# Patient Record
Sex: Female | Born: 1980 | Race: White | Hispanic: No | Marital: Single | Smoking: Never smoker
Health system: Southern US, Community
[De-identification: ages and names within clinical notes are randomized; demographics above are authoritative.]

## PROBLEM LIST (undated history)

## (undated) DIAGNOSIS — K219 Gastro-esophageal reflux disease without esophagitis: Secondary | ICD-10-CM

## (undated) DIAGNOSIS — I370 Nonrheumatic pulmonary valve stenosis: Secondary | ICD-10-CM

## (undated) DIAGNOSIS — R011 Cardiac murmur, unspecified: Secondary | ICD-10-CM

## (undated) DIAGNOSIS — Z9109 Other allergy status, other than to drugs and biological substances: Secondary | ICD-10-CM

## (undated) DIAGNOSIS — R160 Hepatomegaly, not elsewhere classified: Secondary | ICD-10-CM

## (undated) HISTORY — DX: Other allergy status, other than to drugs and biological substances: Z91.09

## (undated) HISTORY — DX: Cardiac murmur, unspecified: R01.1

## (undated) HISTORY — DX: Hepatomegaly, not elsewhere classified: R16.0

## (undated) HISTORY — DX: Gastro-esophageal reflux disease without esophagitis: K21.9

## (undated) HISTORY — DX: Nonrheumatic pulmonary valve stenosis: I37.0

---

## 2002-05-08 ENCOUNTER — Other Ambulatory Visit: Admission: RE | Admit: 2002-05-08 | Discharge: 2002-05-08 | Payer: Self-pay | Admitting: Internal Medicine

## 2002-06-10 ENCOUNTER — Encounter: Admission: RE | Admit: 2002-06-10 | Discharge: 2002-09-08 | Payer: Self-pay | Admitting: Internal Medicine

## 2002-09-02 ENCOUNTER — Encounter: Payer: Self-pay | Admitting: Internal Medicine

## 2002-09-02 ENCOUNTER — Encounter: Admission: RE | Admit: 2002-09-02 | Discharge: 2002-09-02 | Payer: Self-pay | Admitting: Internal Medicine

## 2002-09-30 ENCOUNTER — Encounter: Admission: RE | Admit: 2002-09-30 | Discharge: 2002-12-29 | Payer: Self-pay | Admitting: Internal Medicine

## 2003-02-01 ENCOUNTER — Encounter: Admission: RE | Admit: 2003-02-01 | Discharge: 2003-05-02 | Payer: Self-pay | Admitting: Internal Medicine

## 2012-07-23 ENCOUNTER — Other Ambulatory Visit: Payer: Self-pay | Admitting: Gastroenterology

## 2012-07-23 DIAGNOSIS — K219 Gastro-esophageal reflux disease without esophagitis: Secondary | ICD-10-CM

## 2012-08-18 ENCOUNTER — Ambulatory Visit
Admission: RE | Admit: 2012-08-18 | Discharge: 2012-08-18 | Disposition: A | Discharge status: Home / Self Care | Payer: BC Managed Care – PPO | Source: Ambulatory Visit | Attending: Gastroenterology | Admitting: Gastroenterology

## 2012-08-18 ENCOUNTER — Other Ambulatory Visit: Payer: Self-pay

## 2012-08-18 DIAGNOSIS — K219 Gastro-esophageal reflux disease without esophagitis: Secondary | ICD-10-CM

## 2013-07-22 ENCOUNTER — Other Ambulatory Visit: Payer: Self-pay | Admitting: Family Medicine

## 2013-07-22 ENCOUNTER — Other Ambulatory Visit (HOSPITAL_COMMUNITY)
Admission: RE | Admit: 2013-07-22 | Discharge: 2013-07-22 | Disposition: A | Discharge status: Home / Self Care | Payer: BC Managed Care – PPO | Source: Ambulatory Visit | Attending: Family Medicine | Admitting: Family Medicine

## 2013-07-22 DIAGNOSIS — Z01419 Encounter for gynecological examination (general) (routine) without abnormal findings: Secondary | ICD-10-CM | POA: Insufficient documentation

## 2013-09-10 ENCOUNTER — Encounter: Payer: Self-pay | Admitting: General Surgery

## 2013-09-10 ENCOUNTER — Telehealth: Payer: Self-pay | Admitting: Cardiology

## 2013-09-10 NOTE — Telephone Encounter (Signed)
Patient does not need SBE prophylaxis for dental procedures.  She has very mild PS and there is no indication for SBE prophylaxis.

## 2013-09-10 NOTE — Telephone Encounter (Signed)
Faxed signed copy over to pts dentist for pt.

## 2013-09-10 NOTE — Telephone Encounter (Signed)
Please get echo and last OV note

## 2013-09-10 NOTE — Telephone Encounter (Signed)
Printed and given to Dr Mayford Knifeurner

## 2013-09-10 NOTE — Telephone Encounter (Signed)
New message  Patient has dental appt on Monday and needs a note faxed over that she doest have to take a pre med prior to dental work. Fax # [phone].

## 2013-09-10 NOTE — Telephone Encounter (Signed)
To Dr Mayford Knifeurner to have her state pt does not need pre med prior to dental work.

## 2014-02-19 ENCOUNTER — Encounter: Payer: Self-pay | Admitting: General Surgery

## 2014-02-19 DIAGNOSIS — I370 Nonrheumatic pulmonary valve stenosis: Secondary | ICD-10-CM

## 2014-03-25 ENCOUNTER — Encounter: Payer: Self-pay | Admitting: Cardiology

## 2014-03-25 ENCOUNTER — Ambulatory Visit (INDEPENDENT_AMBULATORY_CARE_PROVIDER_SITE_OTHER): Payer: BC Managed Care – PPO | Admitting: Cardiology

## 2014-03-25 VITALS — BP 122/78 | HR 84 | Ht 66.0 in | Wt 195.1 lb

## 2014-03-25 DIAGNOSIS — I379 Nonrheumatic pulmonary valve disorder, unspecified: Secondary | ICD-10-CM

## 2014-03-25 DIAGNOSIS — I370 Nonrheumatic pulmonary valve stenosis: Secondary | ICD-10-CM

## 2014-03-25 NOTE — Progress Notes (Signed)
  [address] [place], [place]  [address] Phone: [phone]) [phone] Fax:  [phone](336) [phone]  Date:  03/25/2014   ID:  Kathleen ServiceAmber Little, DOB 1980/10/09, MRN [id number]  PCP:  Leanor RubensteinSUN,VYVYAN Y, MD  Cardiologist:  Armanda Magicraci Lamaj Metoyer, MD     History of Present Illness: Kathleen Little is a 33 y.o. female with a history of pulmonic stenosis who presents today for followup.  She is doing well.  She denies any chest pain, SOB, DOE, LE edema, dizziness, or syncope.  Occasionally she will have some skipped heart beats.   Wt Readings from Last 3 Encounters:  03/25/14 195 lb 1.9 oz (88.506 kg)     Past Medical History  Diagnosis Date  . Pulmonic stenosis     Dr Mayford Knifeturner  . GERD (gastroesophageal reflux disease)   . Environmental allergies   . Heart murmur     Current Outpatient Prescriptions  Medication Sig Dispense Refill  . cetirizine (ZYRTEC) 10 MG tablet Take 10 mg by mouth daily.      Marland Kitchen. levonorgestrel-ethinyl estradiol (SEASONALE,INTROVALE,JOLESSA) 0.15-0.03 MG tablet Take 1 tablet by mouth daily.       No current facility-administered medications for this visit.    Allergies:   No Known Allergies  Social History:  The patient  reports that she has never smoked. She does not have any smokeless tobacco history on file. She reports that she drinks alcohol. She reports that she does not use illicit drugs.   Family History:  The patient's family history includes Cancer - Colon in her father.   ROS:  Please see the history of present illness.      All other systems reviewed and negative.   PHYSICAL EXAM: VS:  BP 122/78  Pulse 84  Ht 5\' 6"  (1.676 m)  Wt 195 lb 1.9 oz (88.506 kg)  BMI 31.51 kg/m2 Well nourished, well developed, in no acute distress HEENT: normal Neck: no JVD Cardiac:  normal S1, S2; RRR; faint systolic murmur at RUSB Lungs:  clear to auscultation bilaterally, no wheezing, rhonchi or rales Abd: soft, nontender, no hepatomegaly Ext: no edema Skin: warm and dry Neuro:  CNs  2-12 intact, no focal abnormalities noted  EKG:  NSR with no ST changes     ASSESSMENT AND PLAN:  Pulmonic stenosis - Following with serial echoes every 3 year- will set up for next year right before her OV with me  Followup with me in 1 year  Signed, Armanda Magicraci Shaniquia Brafford, MD 03/25/2014 10:15 AM

## 2014-03-25 NOTE — Patient Instructions (Signed)
Your physician recommends that you continue on your current medications as directed. Please refer to the Current Medication list given to you today.  Your physician has requested that you have an echocardiogram. Echocardiography is a painless test that uses sound waves to create images of your heart. It provides your doctor with information about the size and shape of your heart and how well your heart's chambers and valves are working. This procedure takes approximately one hour. There are no restrictions for this procedure. (Schedule 02/2015 one week prior to office vist with Dr Mayford Knifeurner)  Your physician wants you to follow-up in: 1 year with Dr Sherlyn Lickurner You will receive a reminder letter in the mail two months in advance. If you don't receive a letter, please call our office to schedule the follow-up appointment.

## 2014-03-31 ENCOUNTER — Encounter: Payer: Self-pay | Admitting: Cardiology

## 2014-04-09 ENCOUNTER — Ambulatory Visit: Payer: BC Managed Care – PPO | Admitting: Cardiology

## 2014-12-21 ENCOUNTER — Encounter: Payer: Self-pay | Admitting: Cardiology

## 2015-03-25 ENCOUNTER — Ambulatory Visit (HOSPITAL_COMMUNITY): Payer: BC Managed Care – PPO | Attending: Cardiology

## 2015-03-25 ENCOUNTER — Other Ambulatory Visit: Payer: Self-pay

## 2015-03-25 ENCOUNTER — Other Ambulatory Visit: Payer: Self-pay | Admitting: Cardiology

## 2015-03-25 DIAGNOSIS — I370 Nonrheumatic pulmonary valve stenosis: Secondary | ICD-10-CM

## 2015-03-25 DIAGNOSIS — I371 Nonrheumatic pulmonary valve insufficiency: Secondary | ICD-10-CM | POA: Insufficient documentation

## 2015-03-25 DIAGNOSIS — I340 Nonrheumatic mitral (valve) insufficiency: Secondary | ICD-10-CM | POA: Diagnosis not present

## 2015-04-04 ENCOUNTER — Ambulatory Visit: Payer: BC Managed Care – PPO | Admitting: Cardiology

## 2015-05-05 ENCOUNTER — Ambulatory Visit: Payer: BC Managed Care – PPO | Admitting: Cardiology

## 2016-03-30 ENCOUNTER — Other Ambulatory Visit: Payer: Self-pay | Admitting: Family Medicine

## 2016-03-30 ENCOUNTER — Ambulatory Visit
Admission: RE | Admit: 2016-03-30 | Discharge: 2016-03-30 | Disposition: A | Discharge status: Home / Self Care | Payer: BC Managed Care – PPO | Source: Ambulatory Visit | Attending: Family Medicine | Admitting: Family Medicine

## 2016-03-30 DIAGNOSIS — M25561 Pain in right knee: Secondary | ICD-10-CM

## 2016-06-25 ENCOUNTER — Ambulatory Visit: Payer: BC Managed Care – PPO | Admitting: Cardiology

## 2016-07-06 ENCOUNTER — Other Ambulatory Visit: Payer: Self-pay | Admitting: Family Medicine

## 2016-07-06 ENCOUNTER — Other Ambulatory Visit (HOSPITAL_COMMUNITY)
Admission: RE | Admit: 2016-07-06 | Discharge: 2016-07-06 | Disposition: A | Discharge status: Home / Self Care | Payer: BC Managed Care – PPO | Source: Ambulatory Visit | Attending: Family Medicine | Admitting: Family Medicine

## 2016-07-06 DIAGNOSIS — Z1151 Encounter for screening for human papillomavirus (HPV): Secondary | ICD-10-CM | POA: Insufficient documentation

## 2016-07-06 DIAGNOSIS — Z01411 Encounter for gynecological examination (general) (routine) with abnormal findings: Secondary | ICD-10-CM | POA: Insufficient documentation

## 2016-07-09 LAB — CYTOLOGY - PAP
Diagnosis: NEGATIVE
HPV: NOT DETECTED

## 2016-08-01 ENCOUNTER — Encounter: Payer: Self-pay | Admitting: Cardiology

## 2016-08-05 NOTE — Progress Notes (Signed)
Cardiology Office Note    Date:  08/06/2016   ID:  Kathleen ServiceAmber Ganim, DOB 1980-10-02, MRN [id number]  PCP:  Leanor RubensteinSUN,VYVYAN Y, MD  Cardiologist:  Armanda Magicraci Brynlei Klausner, MD   Chief Complaint  Patient presents with  . Follow-up    pulmonic stenosis    History of Present Illness:  Kathleen Servicember Seybold is a 35 y.o. female with a history of pulmonic stenosis who presents today for followup.  She is doing well.  She denies any chest pain, SOB, DOE, LE edema, dizziness, or syncope. She has not had any problems with palpitations until this past weekend.  She has had some problems with sinus issues and has had a sinus headache and has been taking cold meds.    Past Medical History:  Diagnosis Date  . Environmental allergies   . GERD (gastroesophageal reflux disease)   . Heart murmur   . Pulmonic stenosis    Dr Mayford Knifeturner    No past surgical history on file.  Current Medications: Outpatient Medications Prior to Visit  Medication Sig Dispense Refill  . cetirizine (ZYRTEC) 10 MG tablet Take 10 mg by mouth daily.    Marland Kitchen. levonorgestrel-ethinyl estradiol (SEASONALE,INTROVALE,JOLESSA) 0.15-0.03 MG tablet Take 1 tablet by mouth daily.     No facility-administered medications prior to visit.      Allergies:   Patient has no known allergies.   Social History   Social History  . Marital status: Single    Spouse name: N/A  . Number of children: N/A  . Years of education: N/A   Social History Main Topics  . Smoking status: Never Smoker  . Smokeless tobacco: Never Used  . Alcohol use Yes     Comment: rare glass of wine  . Drug use: No  . Sexual activity: Not on file   Other Topics Concern  . Not on file   Social History Narrative  . No narrative on file     Family History:  The patient's family history includes Cancer - Colon in her father.   ROS:   Please see the history of present illness.    ROS All other systems reviewed and are negative.  No flowsheet data found.     PHYSICAL EXAM:   VS:   BP 136/82   Pulse 84   Ht 5' 5.5" (1.664 m)   Wt 193 lb (87.5 kg)   BMI 31.63 kg/m    GEN: Well nourished, well developed, in no acute distress  HEENT: normal  Neck: no JVD, carotid bruits, or masses Cardiac: RRR; no  rubs, or gallops,no edema.  Intact distal pulses bilaterally.   2/6 SM at LUSB to LLSB Respiratory:  clear to auscultation bilaterally, normal work of breathing GI: soft, nontender, nondistended, + BS MS: no deformity or atrophy  Skin: warm and dry, no rash Neuro:  Alert and Oriented x 3, Strength and sensation are intact Psych: euthymic mood, full affect  Wt Readings from Last 3 Encounters:  08/06/16 193 lb (87.5 kg)  03/25/14 195 lb 1.9 oz (88.5 kg)      Studies/Labs Reviewed:   EKG:  EKG is ordered today.  The ekg ordered today demonstrates NSR at 84bpm with no ST changes  Recent Labs: No results found for requested labs within last 8760 hours.   Lipid Panel No results found for: CHOL, TRIG, HDL, CHOLHDL, VLDL, LDLCALC, LDLDIRECT  Additional studies/ records that were reviewed today include:  none    ASSESSMENT:    1. Pulmonary valve stenosis,  unspecified etiology   2. Heart palpitations      PLAN:  In order of problems listed above:  1. Pulmonic stenosis - mild by echo a year ago  - Repeat echo in 1 year 2. Palpitation- she has had some palpitations over the weekend but has been taking some cold meds.  She says that the palpitations are not bad like they were in 2013 when a heart monitor was normal.  I encouraged her to let me know if they do not resolve and we can repeat a heart monitor.       Medication Adjustments/Labs and Tests Ordered: Current medicines are reviewed at length with the patient today.  Concerns regarding medicines are outlined above.  Medication changes, Labs and Tests ordered today are listed in the Patient Instructions below.  There are no Patient Instructions on file for this visit.   Signed, Armanda Magicraci Kelsa Jaworowski, MD    08/06/2016 8:57 AM    [facility] [address], [place], [place]  [address] Phone: [phone](336) [phone]; Fax: [phone]) [phone]

## 2016-08-06 ENCOUNTER — Ambulatory Visit (INDEPENDENT_AMBULATORY_CARE_PROVIDER_SITE_OTHER): Payer: BC Managed Care – PPO | Admitting: Cardiology

## 2016-08-06 VITALS — BP 136/82 | HR 84 | Ht 65.5 in | Wt 193.0 lb

## 2016-08-06 DIAGNOSIS — R002 Palpitations: Secondary | ICD-10-CM

## 2016-08-06 DIAGNOSIS — I370 Nonrheumatic pulmonary valve stenosis: Secondary | ICD-10-CM

## 2016-08-06 NOTE — Patient Instructions (Signed)

## 2017-05-15 IMAGING — CR DG KNEE 1-2V*R*
2 series · 2 of 2 positions shown · non-contrast
Comparison: None.

CLINICAL DATA: Anterior right knee pain after walking.

EXAM:
RIGHT KNEE - 1-2 VIEW

[w knee ap right]
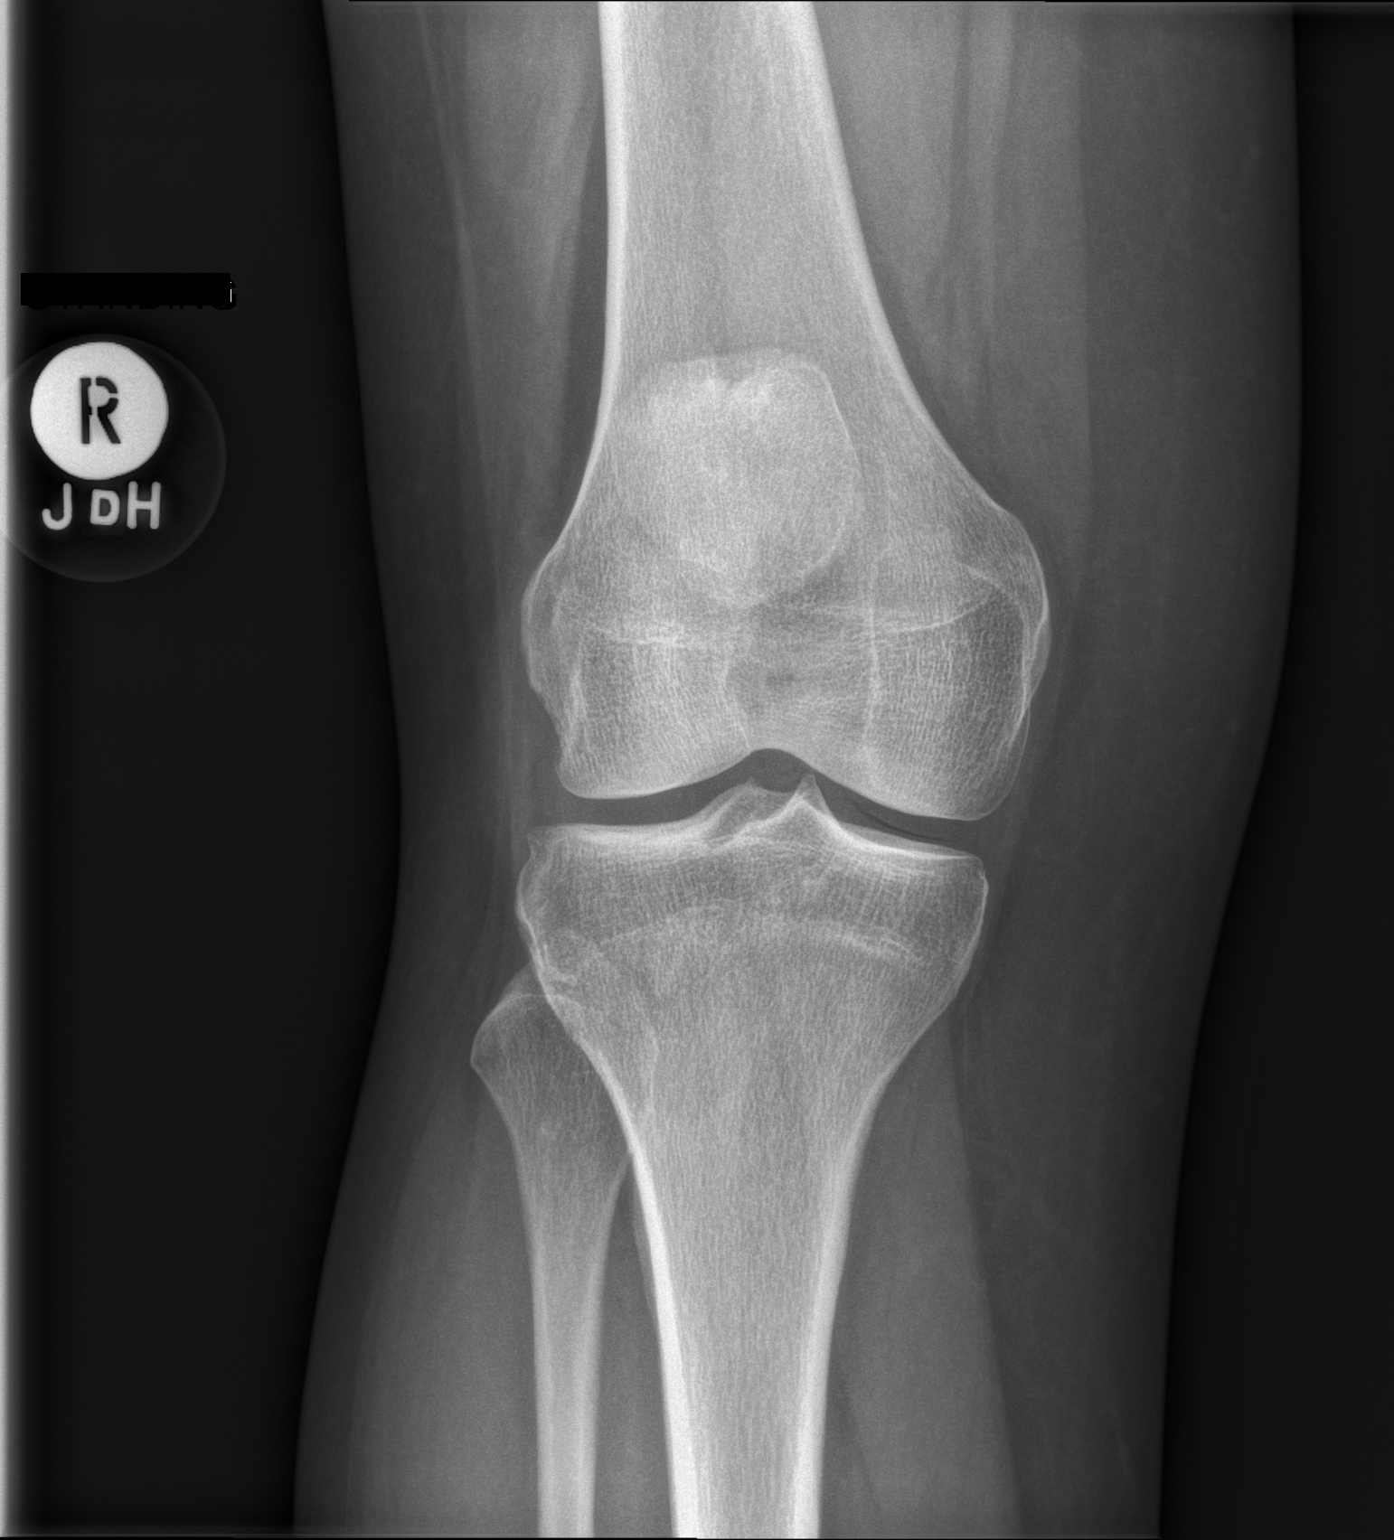

[w knee lat right]
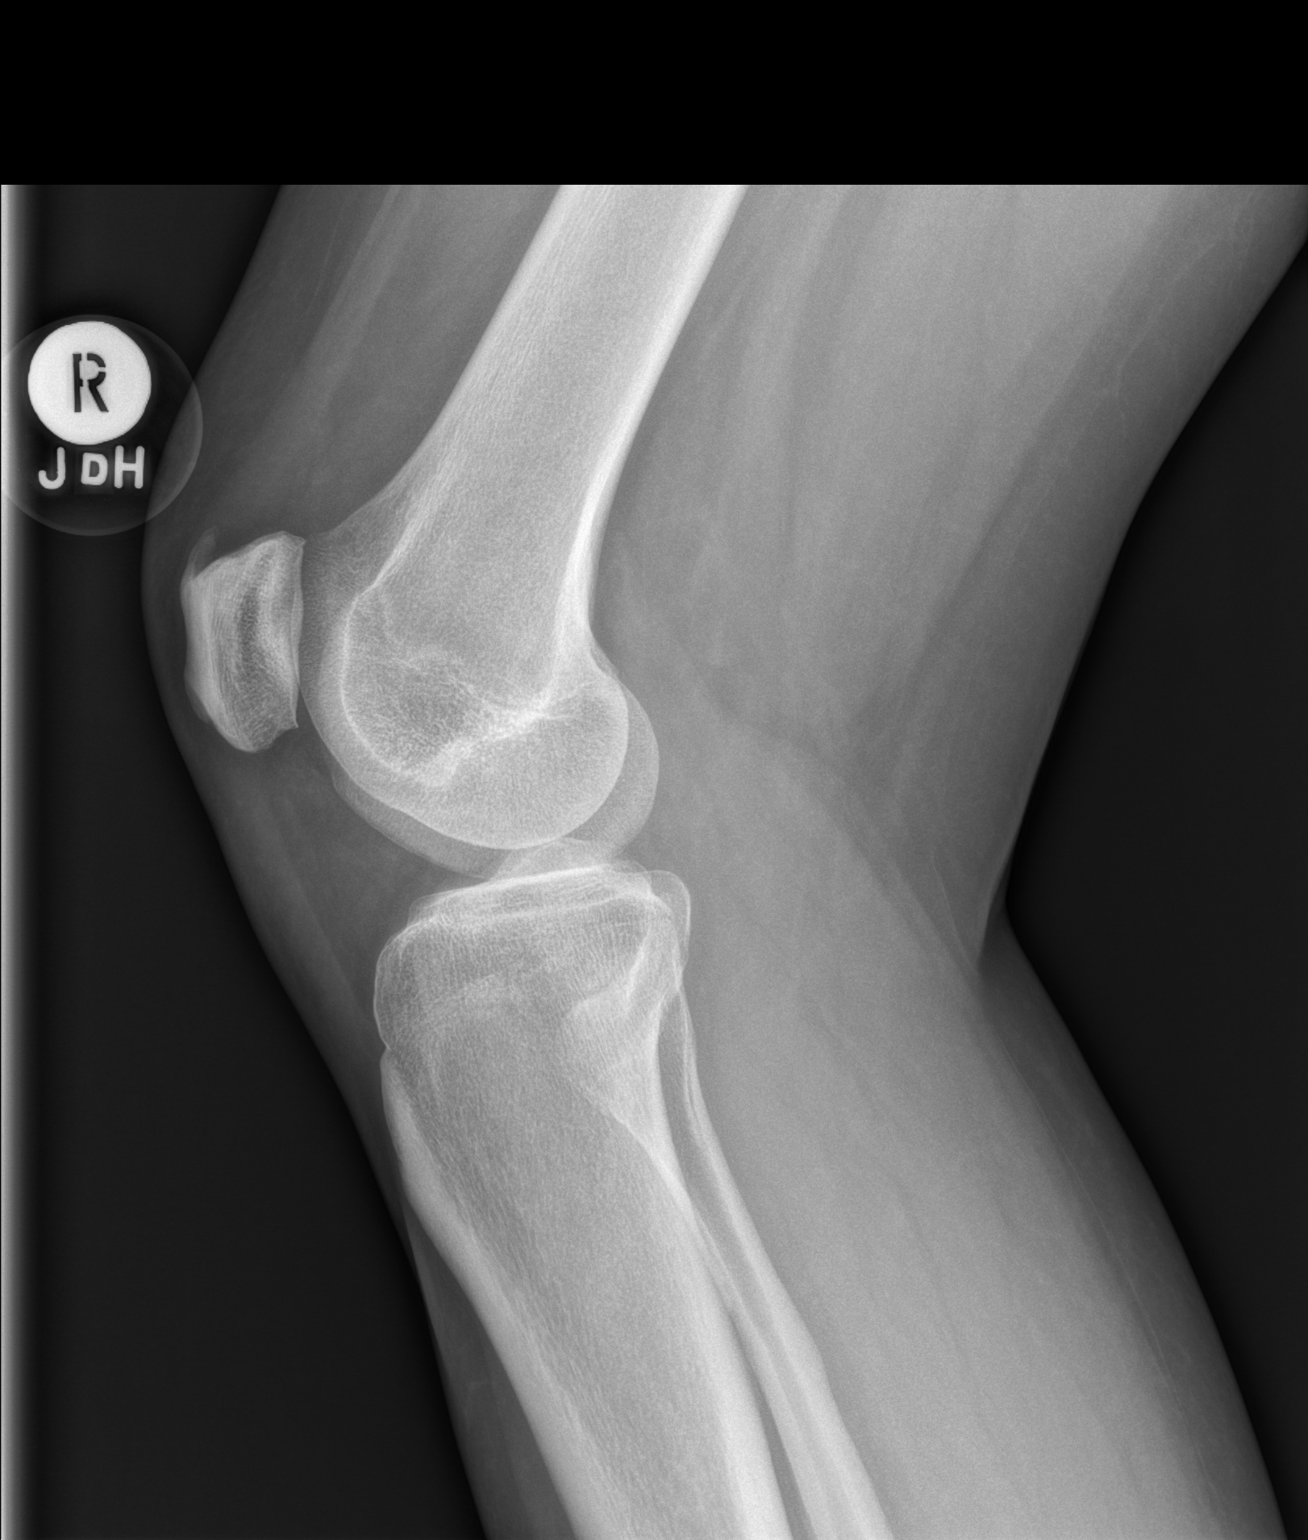

[2 of 2 positions shown; findings below may reference images not displayed]

FINDINGS: Mild tibial spine spur formation. Minimal lateral and posterior
patellar spur formation. Moderate-sized anterior patellar spurs. No
effusion.
IMPRESSION: Minimal degenerative changes.

## 2019-03-12 ENCOUNTER — Other Ambulatory Visit (HOSPITAL_COMMUNITY)
Admission: RE | Admit: 2019-03-12 | Discharge: 2019-03-12 | Disposition: A | Discharge status: Home / Self Care | Payer: BC Managed Care – PPO | Source: Ambulatory Visit | Attending: Family Medicine | Admitting: Family Medicine

## 2019-03-12 ENCOUNTER — Other Ambulatory Visit: Payer: Self-pay | Admitting: Family Medicine

## 2019-03-12 DIAGNOSIS — Z01411 Encounter for gynecological examination (general) (routine) with abnormal findings: Secondary | ICD-10-CM | POA: Diagnosis present

## 2019-03-17 LAB — CYTOLOGY - PAP
Diagnosis: NEGATIVE
HPV: NOT DETECTED

## 2019-06-29 NOTE — Progress Notes (Signed)
Cardiology Office Note:    Date:  06/30/2019   ID:  [name], DOB 1981-05-24, MRN [id number]  PCP:  Deatra James, MD  Cardiologist:  No primary care provider on file.    Referring MD: Deatra James, MD   Chief Complaint  Patient presents with  . Follow-up    Pulmonic stenosis    History of Present Illness:    Kathleen Little is a 38 y.o. female with a hx of pulmonic stenosis.  She is here today for followup and is doing well.  She denies any chest pain or pressure, SOB, DOE, PND, orthopnea, LE edema, dizziness, palpitations or syncope. She is compliant with her meds and is tolerating meds with no SE.    Past Medical History:  Diagnosis Date  . Environmental allergies   . GERD (gastroesophageal reflux disease)   . Heart murmur   . Pulmonic stenosis    Dr Mayford Knife    History reviewed. No pertinent surgical history.  Current Medications: Current Meds  Medication Sig  . cetirizine (ZYRTEC) 10 MG tablet Take 10 mg by mouth daily.     Allergies:   Patient has no known allergies.   Social History   Socioeconomic History  . Marital status: Single    Spouse name: Not on file  . Number of children: Not on file  . Years of education: Not on file  . Highest education level: Not on file  Occupational History  . Not on file  Social Needs  . Financial resource strain: Not on file  . Food insecurity    Worry: Not on file    Inability: Not on file  . Transportation needs    Medical: Not on file    Non-medical: Not on file  Tobacco Use  . Smoking status: Never Smoker  . Smokeless tobacco: Never Used  Substance and Sexual Activity  . Alcohol use: Yes    Comment: rare glass of wine  . Drug use: No  . Sexual activity: Not on file  Lifestyle  . Physical activity    Days per week: Not on file    Minutes per session: Not on file  . Stress: Not on file  Relationships  . Social Musician on phone: Not on file    Gets together: Not on file    Attends religious  service: Not on file    Active member of club or organization: Not on file    Attends meetings of clubs or organizations: Not on file    Relationship status: Not on file  Other Topics Concern  . Not on file  Social History Narrative  . Not on file     Family History: The patient's family history includes Cancer - Colon in her father.  ROS:   Please see the history of present illness.    ROS  All other systems reviewed and negative.   EKGs/Labs/Other Studies Reviewed:    The following studies were reviewed today: none  EKG:  EKG is  ordered today.  The ekg ordered today demonstrates NSR with no ST changes  Recent Labs: No results found for requested labs within last 8760 hours.   Recent Lipid Panel No results found for: CHOL, TRIG, HDL, CHOLHDL, VLDL, LDLCALC, LDLDIRECT  Physical Exam:    VS:  BP 130/80   Pulse 87   Ht 5' 5.5" (1.664 m)   Wt 186 lb 9.6 oz (84.6 kg)   BMI 30.58 kg/m     Wt  Readings from Last 3 Encounters:  06/30/19 186 lb 9.6 oz (84.6 kg)  08/06/16 193 lb (87.5 kg)  03/25/14 195 lb 1.9 oz (88.5 kg)     GEN:  Well nourished, well developed in no acute distress HEENT: Normal NECK: No JVD; No carotid bruits LYMPHATICS: No lymphadenopathy CARDIAC: RRR, no murmurs, rubs, gallops RESPIRATORY:  Clear to auscultation without rales, wheezing or rhonchi  ABDOMEN: Soft, non-tender, non-distended MUSCULOSKELETAL:  No edema; No deformity  SKIN: Warm and dry NEUROLOGIC:  Alert and oriented x 3 PSYCHIATRIC:  Normal affect   ASSESSMENT:    1. Nonrheumatic pulmonary valve stenosis    PLAN:    In order of problems listed above:  1.  Pulmonic stenosis -she has not been seen in almost 3 years and last echo 01/2015 showed mild PS with peak PV gradient 19mmHg and mild PR -repeat echo to make sure PS and PR have not progressed  Medication Adjustments/Labs and Tests Ordered: Current medicines are reviewed at length with the patient today.  Concerns  regarding medicines are outlined above.  Orders Placed This Encounter  Procedures  . EKG 12-Lead   No orders of the defined types were placed in this encounter.   Signed, Fransico Him, MD  06/30/2019 1:28 PM    [place]

## 2019-06-30 ENCOUNTER — Encounter: Payer: Self-pay | Admitting: Cardiology

## 2019-06-30 ENCOUNTER — Ambulatory Visit: Payer: BC Managed Care – PPO | Admitting: Cardiology

## 2019-06-30 ENCOUNTER — Other Ambulatory Visit: Payer: Self-pay

## 2019-06-30 ENCOUNTER — Encounter (INDEPENDENT_AMBULATORY_CARE_PROVIDER_SITE_OTHER): Payer: Self-pay

## 2019-06-30 VITALS — BP 130/80 | HR 87 | Ht 65.5 in | Wt 186.6 lb

## 2019-06-30 DIAGNOSIS — I370 Nonrheumatic pulmonary valve stenosis: Secondary | ICD-10-CM | POA: Diagnosis not present

## 2019-06-30 NOTE — Patient Instructions (Signed)
Medication Instructions:   Your physician recommends that you continue on your current medications as directed. Please refer to the Current Medication list given to you today.  *If you need a refill on your cardiac medications before your next appointment, please call your pharmacy*    Testing/Procedures:  Your physician has requested that you have an echocardiogram. Echocardiography is a painless test that uses sound waves to create images of your heart. It provides your doctor with information about the size and shape of your heart and how well your heart's chambers and valves are working. This procedure takes approximately one hour. There are no restrictions for this procedure.    Follow-Up: At [facility], you and your health needs are our priority.  As part of our continuing mission to provide you with exceptional heart care, we have created designated Provider Care Teams.  These Care Teams include your primary Cardiologist (physician) and Advanced Practice Providers (APPs -  Physician Assistants and Nurse Practitioners) who all work together to provide you with the care you need, when you need it.  Your next appointment:   12 months  The format for your next appointment:   Either In Person or Virtual  Provider:   Fransico Him, MD

## 2019-07-13 ENCOUNTER — Telehealth: Payer: Self-pay | Admitting: Cardiology

## 2019-07-13 NOTE — Telephone Encounter (Signed)
Nita from [facility] called. Stated patient's PCP is Dr. Maurice Small. Patient's chart updated.

## 2019-07-22 ENCOUNTER — Other Ambulatory Visit: Payer: Self-pay

## 2019-07-22 ENCOUNTER — Ambulatory Visit (HOSPITAL_COMMUNITY): Payer: BC Managed Care – PPO | Attending: Cardiovascular Disease

## 2019-07-22 DIAGNOSIS — I370 Nonrheumatic pulmonary valve stenosis: Secondary | ICD-10-CM | POA: Insufficient documentation

## 2019-07-22 NOTE — Progress Notes (Signed)
Patient did not consent to the use of Definity. 

## 2019-07-24 ENCOUNTER — Encounter: Payer: Self-pay | Admitting: Cardiology

## 2019-07-27 ENCOUNTER — Telehealth: Payer: Self-pay

## 2019-07-27 NOTE — Telephone Encounter (Signed)
Notes recorded by Frederik Schmidt, RN on 07/27/2019 at 9:10 AM EST  The patient has been notified of the result and verbalized understanding. All questions (if any) were answered.  Frederik Schmidt, RN 07/27/2019 9:10 AM

## 2019-07-27 NOTE — Telephone Encounter (Signed)
-----   Message from Sueanne Margarita, MD sent at 07/24/2019  9:56 AM EST ----- Echo showed normal LVF with trivial leakiness of AV and mild pulmonic stenosis and mildly leaky PV. Compared to prior echo , the mean PVG is 72mmHg and peak PVG 53mmHg which is same as in 2016.   Repeat echo in 2 years

## 2019-10-24 ENCOUNTER — Ambulatory Visit: Payer: BC Managed Care – PPO | Attending: Internal Medicine

## 2019-10-24 DIAGNOSIS — Z23 Encounter for immunization: Secondary | ICD-10-CM | POA: Insufficient documentation

## 2019-10-24 NOTE — Progress Notes (Signed)
   Covid-19 Vaccination Clinic  Name:  Kathleen Little    MRN: [id number] DOB: 21-Jun-1981  10/24/2019  Ms. Reach was observed post Covid-19 immunization for 15 minutes without incidence. She was provided with Vaccine Information Sheet and instruction to access the V-Safe system.   [name] was instructed to call 911 with any severe reactions post vaccine: Marland Kitchen Difficulty breathing  . Swelling of your face and throat  . A fast heartbeat  . A bad rash all over your body  . Dizziness and weakness    Immunizations Administered    Name Date Dose VIS Date Route   Pfizer COVID-19 Vaccine 10/24/2019 11:02 AM 0.3 mL 08/07/2019 Intramuscular   Manufacturer: [employer], [employer]   Lot: [id number]   NDC: [address]-8

## 2019-11-14 ENCOUNTER — Ambulatory Visit: Payer: BC Managed Care – PPO

## 2019-11-18 ENCOUNTER — Ambulatory Visit: Payer: BC Managed Care – PPO

## 2019-11-18 ENCOUNTER — Ambulatory Visit: Payer: BC Managed Care – PPO | Attending: Internal Medicine

## 2019-11-18 DIAGNOSIS — Z23 Encounter for immunization: Secondary | ICD-10-CM

## 2019-11-18 NOTE — Progress Notes (Signed)
   Covid-19 Vaccination Clinic  Name:  Kathleen Little    MRN: [id number] DOB: 02/16/1981  11/18/2019  [name] was observed post Covid-19 immunization for 15 minutes without incident. She was provided with Vaccine Information Sheet and instruction to access the V-Safe system.   [name] was instructed to call 911 with any severe reactions post vaccine: Marland Kitchen Difficulty breathing  . Swelling of face and throat  . A fast heartbeat  . A bad rash all over body  . Dizziness and weakness   Immunizations Administered    Name Date Dose VIS Date Route   Pfizer COVID-19 Vaccine 11/18/2019 10:32 AM 0.3 mL 08/07/2019 Intramuscular   Manufacturer: [employer], [employer]   Lot: [id number]   NDC: [address]-1

## 2020-08-22 ENCOUNTER — Ambulatory Visit: Payer: BC Managed Care – PPO | Admitting: Cardiology

## 2020-08-22 ENCOUNTER — Encounter: Payer: Self-pay | Admitting: Cardiology

## 2020-08-22 ENCOUNTER — Other Ambulatory Visit: Payer: Self-pay

## 2020-08-22 VITALS — BP 138/80 | HR 70 | Ht 65.5 in | Wt 187.4 lb

## 2020-08-22 DIAGNOSIS — I370 Nonrheumatic pulmonary valve stenosis: Secondary | ICD-10-CM | POA: Diagnosis not present

## 2020-08-22 NOTE — Progress Notes (Signed)
Cardiology Office Note:    Date:  08/22/2020   ID:  Kathleen Little, DOB [birth year], MRN [id number]  PCP:  Shirlean Mylar, MD  Cardiologist:  No primary care provider on file.    Referring MD: Shirlean Mylar, MD   Chief Complaint  Patient presents with  . Follow-up    Pulmonic stenosis    History of Present Illness:    Kathleen Little is a 39 y.o. female with a hx of pulmonic stenosis.  She is here today for followup and is doing well.  She denies any exertional chest pain or pressure, SOB, DOE, PND, orthopnea, LE edema, dizziness, palpitations or syncope.  She has chronic GERD sx.  She exercises frequently and has no problems with symptoms. She is compliant with her meds and is tolerating meds with no SE.    Past Medical History:  Diagnosis Date  . Environmental allergies   . GERD (gastroesophageal reflux disease)   . Heart murmur   . Pulmonic stenosis    2D echo 06/2019 showed mild PS with peak PVG and mean PVG , mild PR    No past surgical history on file.  Current Medications: Current Meds  Medication Sig  . ASHLYNA 0.15-0.03 &0.01 MG tablet Take 1 tablet by mouth daily.  . cetirizine (ZYRTEC) 10 MG tablet Take 10 mg by mouth daily.     Allergies:   Patient has no known allergies.   Social History   Socioeconomic History  . Marital status: Single    Spouse name: Not on file  . Number of children: Not on file  . Years of education: Not on file  . Highest education level: Not on file  Occupational History  . Not on file  Tobacco Use  . Smoking status: Never Smoker  . Smokeless tobacco: Never Used  Vaping Use  . Vaping Use: Never used  Substance and Sexual Activity  . Alcohol use: Yes    Comment: rare glass of wine  . Drug use: No  . Sexual activity: Not on file  Other Topics Concern  . Not on file  Social History Narrative  . Not on file   Social Determinants of Health   Financial Resource Strain: Not on file  Food Insecurity: Not on file   Transportation Needs: Not on file  Physical Activity: Not on file  Stress: Not on file  Social Connections: Not on file     Family History: The patient's family history includes Cancer - Colon in her father.  ROS:   Please see the history of present illness.    ROS  All other systems reviewed and negative.   EKGs/Labs/Other Studies Reviewed:    The following studies were reviewed today: none  EKG:  EKG is  ordered today.  The ekg ordered today demonstrates  NSR with no ST changes  Recent Labs: No results found for requested labs within last 8760 hours.   Recent Lipid Panel No results found for: CHOL, TRIG, HDL, CHOLHDL, VLDL, LDLCALC, LDLDIRECT  Physical Exam:    VS:  BP 138/80   Pulse 70   Ht 5' 5.5" (1.664 m)   Wt 187 lb 6.4 oz (85 kg)   SpO2 96%   BMI 30.71 kg/m     Wt Readings from Last 3 Encounters:  08/22/20 187 lb 6.4 oz (85 kg)  06/30/19 186 lb 9.6 oz (84.6 kg)  08/06/16 193 lb (87.5 kg)     GEN: Well nourished, well developed in no acute distress  HEENT: Normal NECK: No JVD; No carotid bruits LYMPHATICS: No lymphadenopathy CARDIAC:RRR, no  rubs, gallops.  1/6 SM at LUSB RESPIRATORY:  Clear to auscultation without rales, wheezing or rhonchi  ABDOMEN: Soft, non-tender, non-distended MUSCULOSKELETAL:  No edema; No deformity  SKIN: Warm and dry NEUROLOGIC:  Alert and oriented x 3 PSYCHIATRIC:  Normal affect    ASSESSMENT:    1. Nonrheumatic pulmonary valve stenosis    PLAN:    In order of problems listed above:  1.  Pulmonic stenosis -2D echo 06/2019 showed mild PS with mean PVG with mild PR -she is completely asymptomatic -repeat echo 07/2021  Medication Adjustments/Labs and Tests Ordered: Current medicines are reviewed at length with the patient today.  Concerns regarding medicines are outlined above.  Orders Placed This Encounter  Procedures  . EKG 12-Lead   No orders of the defined types were placed in this  encounter.   Signed, Armanda Magic, MD  08/22/2020 9:32 AM    [facility]

## 2020-08-22 NOTE — Patient Instructions (Addendum)
Medication Instructions:  Your physician recommends that you continue on your current medications as directed. Please refer to the Current Medication list given to you today.  *If you need a refill on your cardiac medications before your next appointment, please call your pharmacy*  Testing/Procedures: Your physician has requested that you have an echocardiogram in 07/2021. Echocardiography is a painless test that uses sound waves to create images of your heart. It provides your doctor with information about the size and shape of your heart and how well your heart's chambers and valves are working. This procedure takes approximately one hour. There are no restrictions for this procedure.  Follow-Up: At [facility], you and your health needs are our priority.  As part of our continuing mission to provide you with exceptional heart care, we have created designated Provider Care Teams.  These Care Teams include your primary Cardiologist (physician) and Advanced Practice Providers (APPs -  Physician Assistants and Nurse Practitioners) who all work together to provide you with the care you need, when you need it.  We recommend signing up for the patient portal called "MyChart".  Sign up information is provided on this After Visit Summary.  MyChart is used to connect with patients for Virtual Visits (Telemedicine).  Patients are able to view lab/test results, encounter notes, upcoming appointments, etc.  Non-urgent messages can be sent to your provider as well.   To learn more about what you can do with MyChart, go to [email or web address].    Your next appointment:   1 year  The format for your next appointment:   In Person  Provider:   You may see Armanda Magic, MD or one of the following Advanced Practice Providers on your designated Care Team:    Ronie Spies, PA-C  Jacolyn Reedy, PA-C

## 2020-08-22 NOTE — Addendum Note (Signed)
Addended by: Theresia Majors on: 08/22/2020 09:44 AM   Modules accepted: Orders

## 2021-06-30 ENCOUNTER — Telehealth (HOSPITAL_COMMUNITY): Payer: Self-pay | Admitting: Cardiology

## 2021-06-30 NOTE — Telephone Encounter (Signed)
I called patient to schedule her December echocardiogram per Dr. Malachy Mood order. Patient was very upset with me calling and told me that she would not be getting her echo this year and to "Quit calling her". I explained that I was following the protocol and I would send a message to Dr and not call her anymore. Order will be removed from the echo WQ. Thank you

## 2021-07-05 ENCOUNTER — Telehealth: Payer: Self-pay | Admitting: *Deleted

## 2021-07-05 NOTE — Telephone Encounter (Signed)
Called patient at Dr. Norris Cross request to follow up on her echo and not wanting to schedule.  She said she can not afford to do it every two years with her insurance [facility] as Tressie Ellis is not on the clear pricing plan.  Her OV is $95 when she comes and her echo was $1400 and she can't afford it.  She says the last several echos have not changed and she is trying to stretch them out as well as find a doctor that takes clear pricing [facility] Plan  She will call back if anything changes.

## 2021-07-14 ENCOUNTER — Telehealth: Payer: Self-pay | Admitting: *Deleted

## 2021-07-14 NOTE — Telephone Encounter (Signed)
You spoke with this pt a few weeks ago about getting her scheduled for an echo and office visit. The pt at the time was having issues w/ the cost of an office visit as well as echo and is calling in now w/ additional info

## 2021-07-17 NOTE — Telephone Encounter (Signed)
Returned call to patient and she found out there is a Orthoptist for the echo's that is done through [facility] for her [employer] plan.  She saw this on the [employer] webiste under diagnostic imaging procedure page.  I let her know I would send to our PA department and be back in touch.  Please see 11/9 phone note for more information.

## 2021-07-17 NOTE — Telephone Encounter (Signed)
Discussed with Bertram Gala, billing services manager, who explained that with the plan patient has all services require a PA.  This was obtained last year and so the charges that she had would be the same for echo and OV as she had last year.  I returned call to the patient and let her know.  She was appreciative of the call and will not move forward with OV or echo at this time due to cost.  She will likely have to find another provider.

## 2022-07-16 NOTE — Progress Notes (Unsigned)
Cardiology Office Note:    Date:  07/18/2022   ID:  Kathleen Little, DOB Nov 23, 1980, MRN [id number]  PCP:  Shirlean Mylar, MD   [facility] Providers Cardiologist:  None     Referring MD: Shirlean Mylar, MD   Chief Complaint: follow-up pulmonic valve stenosis  History of Present Illness:    Kathleen Little is a very pleasant 41 y.o. female with a hx of pulmonic valve stenosis, GERD  Referred to cardiology and last seen by Dr. Mayford Knife 08/22/2020. Has been asymptomatic. Las echo 07/22/2019 revealed normal LVEF with trivial leakiness of AV and mild pulmonic stenosis with mild pulmonic regurgitation.  Compared to prior echo the mean PVT is 10 mmHg and peak PVT 19 mmHg, same as in 2016.  Plan to repeat echo in 2 years.  Today, she is here alone for follow-up visit. Recent visit with new PCP who encouraged her to follow-up with cardiology for murmur and elevated BP. Typical home SBP is 130s or lower but it was > 140 at PCP visit. She is active with walking, sometimes running, and doing some weight lifting. Sometimes has to stop exercising due to breathing. Does not feel physically exhausted but breathing will get difficult "huffing and puffing." Feel that is has been stable for awhile. Breathing improves when she slows down and rests. She denies lower extremity edema, fatigue, palpitations, melena, hematuria, hemoptysis, diaphoresis, weakness, presyncope, syncope, orthopnea, and PND. Has acid reflux which has been pretty well controlled, occasionally has chest discomfort and is unsure if it could be her heart. Recently saw a nutritionist to help with weight loss. is counting calories - less red meat, less fried food.  Echocardiogram was cost prohibitive in the past, would like to have it done as long as it is affordable.   Past Medical History:  Diagnosis Date   Environmental allergies    GERD (gastroesophageal reflux disease)    Heart murmur    Pulmonic stenosis    2D echo 06/2019 showed mild PS with  peak PVG and mean PVG , mild PR    History reviewed. No pertinent surgical history.  Current Medications: Current Meds  Medication Sig   cetirizine (ZYRTEC) 10 MG tablet Take 10 mg by mouth daily.   ibuprofen (ADVIL) 200 MG tablet Take 200 mg by mouth every 6 (six) hours as needed.   Red Yeast Rice Extract 600 MG CAPS Take 1 capsule by mouth daily.   SUMAtriptan (IMITREX) 25 MG tablet Take 25 mg by mouth every 2 (two) hours as needed for migraine. May repeat in 2 hours if headache persists or recurs.     Allergies:   Patient has no known allergies.   Social History   Socioeconomic History   Marital status: Single    Spouse name: Not on file   Number of children: Not on file   Years of education: Not on file   Highest education level: Not on file  Occupational History   Not on file  Tobacco Use   Smoking status: Never   Smokeless tobacco: Never  Vaping Use   Vaping Use: Never used  Substance and Sexual Activity   Alcohol use: Yes    Comment: rare glass of wine   Drug use: No   Sexual activity: Not on file  Other Topics Concern   Not on file  Social History Narrative   Not on file   Social Determinants of Health   Financial Resource Strain: Not on file  Food Insecurity: Not  on file  Transportation Needs: Not on file  Physical Activity: Not on file  Stress: Not on file  Social Connections: Not on file     Family History: The patient's family history includes Cancer - Colon in her father.  ROS:   Please see the history of present illness.    + DOE All other systems reviewed and are negative.  Labs/Other Studies Reviewed:    The following studies were reviewed today:  Echo 07/22/2019  1. Left ventricular ejection fraction, by visual estimation, is 60 to  65%. The left ventricle has normal function. There is no left ventricular  hypertrophy.   2. The left ventricle has no regional wall motion abnormalities.   3. Global right ventricle has  normal systolic function.The right  ventricular size is normal. No increase in right ventricular wall  thickness.   4. Left atrial size was normal.   5. Right atrial size was normal.   6. The mitral valve is normal in structure. No evidence of mitral valve  regurgitation. No evidence of mitral stenosis.   7. The tricuspid valve is normal in structure. Tricuspid valve  regurgitation is not demonstrated.   8. The aortic valve is normal in structure. Aortic valve regurgitation is  trivial. No evidence of aortic valve sclerosis or stenosis.   9. Mild pulmonic stenosis.  10. The pulmonic valve was not well visualized. Pulmonic valve  regurgitation is mild.  11. No evidence of aortic coarctation.  12. The inferior vena cava is normal in size with greater than 50%  respiratory variability, suggesting right atrial pressure of 3 mmHg.    Recent Labs: From [facility] 02/28/22 Creatinine 0.70, potassium 4.6,   Recent Lipid Panel From [facility] 06/04/22 LDL 139, HDL 52, trigs 173   Risk Assessment/Calculations:       Physical Exam:    VS:  BP 130/78   Pulse 73   Ht 5' 5.5" (1.664 m)   Wt 190 lb 3.2 oz (86.3 kg)   LMP 05/26/2022 (Approximate)   SpO2 98%   BMI 31.17 kg/m     Wt Readings from Last 3 Encounters:  07/18/22 190 lb 3.2 oz (86.3 kg)  08/22/20 187 lb 6.4 oz (85 kg)  06/30/19 186 lb 9.6 oz (84.6 kg)     GEN:  Well nourished, well developed in no acute distress HEENT: Normal NECK: No JVD; No carotid bruits CARDIAC: RRR, 2/6 systolic murmur bilateral USB. No rubs, gallops RESPIRATORY:  Clear to auscultation without rales, wheezing or rhonchi  ABDOMEN: Soft, non-tender, non-distended MUSCULOSKELETAL:  No edema; No deformity. 2+ pedal pulses, equal bilaterally SKIN: Warm and dry NEUROLOGIC:  Alert and oriented x 3 PSYCHIATRIC:  Normal affect   EKG:  EKG is ordered today.  The ekg ordered today demonstrates normal sinus rhythm with sinus arrhythmia at 73 bpm, no ST  abnormality   Diagnoses:    1. Nonrheumatic pulmonary valve stenosis   2. Hyperlipidemia, unspecified hyperlipidemia type   3. Elevated blood pressure reading    Assessment and Plan:     Pulmonic stenosis: Mild pulmonic valve stenosis, mild pulmonic regurgitation on echo 07/22/2019. 2/6 murmur on exam. No chest pain, presyncope, syncope. Reports breathing gets bad at times with exertion but she is usually able to quickly recover.  Would like to get repeat echo, however it may be cost prohibitive.  We have placed the order and she is going to check with her insurance company. If cost-prohibitive, we will see if there is an alternate  location or any patient assistance that may be available.   Elevated BP: Recent elevated BP reading at PCP.  BP is not elevated today. Advised her to monitor at home for goal < 130/80 and to notify us if there are concerns.   Hyperlipidemia: On red yeast rice for elevated cholesterol. Discussion regarding more intense statin for LDL goal < 100. Encouraged her to continue to work on healthy diet full of whole foods, avoiding processed foods and saturated fat.      Disposition: 1 year with Dr. Mayford Knife  Medication Adjustments/Labs and Tests Ordered: Current medicines are reviewed at length with the patient today.  Concerns regarding medicines are outlined above.  Orders Placed This Encounter  Procedures   EKG 12-Lead   ECHOCARDIOGRAM COMPLETE   No orders of the defined types were placed in this encounter.   Patient Instructions  Medication Instructions:   Your physician recommends that you continue on your current medications as directed. Please refer to the Current Medication list given to you today.   *If you need a refill on your cardiac medications before your next appointment, please call your pharmacy*   Lab Work:  None ordered.  If you have labs (blood work) drawn today and your tests are completely normal, you will receive your results only  by: MyChart Message (if you have MyChart) OR A paper copy in the mail If you have any lab test that is abnormal or we need to change your treatment, we will call you to review the results.   Testing/Procedures:  Your physician has requested that you have an echocardiogram. Echocardiography is a painless test that uses sound waves to create images of your heart. It provides your doctor with information about the size and shape of your heart and how well your heart's chambers and valves are working. This procedure takes approximately one hour. There are no restrictions for this procedure. Please do NOT wear perfume, or lotions (deodorant is allowed). Please arrive 15 minutes prior to your appointment time.    Follow-Up: At [facility], you and your health needs are our priority.  As part of our continuing mission to provide you with exceptional heart care, we have created designated Provider Care Teams.  These Care Teams include your primary Cardiologist (physician) and Advanced Practice Providers (APPs -  Physician Assistants and Nurse Practitioners) who all work together to provide you with the care you need, when you need it.  We recommend signing up for the patient portal called "MyChart".  Sign up information is provided on this After Visit Summary.  MyChart is used to connect with patients for Virtual Visits (Telemedicine).  Patients are able to view lab/test results, encounter notes, upcoming appointments, etc.  Non-urgent messages can be sent to your provider as well.   To learn more about what you can do with MyChart, go to [email or web address].    Your next appointment:   1 year(s)  The format for your next appointment:   In Person  Provider:   Carolanne Grumbling, MD   Other Instructions  Your physician wants you to follow-up in: 1 year with Dr. Mayford Knife. You will receive a reminder letter in the mail two months in advance. If you don't receive a letter, please call our  office to schedule the follow-up appointment.  Adopting a Healthy Lifestyle.   Weight: Know what a healthy weight is for you (roughly BMI <25) and aim to maintain this. You can calculate your body mass index on your  smart phone  Diet: Aim for 7+ servings of fruits and vegetables daily Limit animal fats in diet for cholesterol and heart health - choose grass fed whenever available Avoid highly processed foods (fast food burgers, tacos, fried chicken, pizza, hot dogs, french fries)  Saturated fat comes in the form of butter, lard, coconut oil, margarine, partially hydrogenated oils, and fat in meat. These increase your risk of cardiovascular disease.  Use healthy plant oils, such as olive, canola, soy, corn, sunflower and peanut.  Whole foods such as fruits, vegetables and whole grains have fiber  Men need > 38 grams of fiber per day Women need > 25 grams of fiber per day  Load up on vegetables and fruits - one-half of your plate: Aim for color and variety, and remember that potatoes dont count. Go for whole grains - one-quarter of your plate: Whole wheat, barley, wheat berries, quinoa, oats, brown rice, and foods made with them. If you want pasta, go with whole wheat pasta. Protein power - one-quarter of your plate: Fish, chicken, beans, and nuts are all healthy, versatile protein sources. Limit red meat. You need carbohydrates for energy! The type of carbohydrate is more important than the amount. Choose carbohydrates such as vegetables, fruits, whole grains, beans, and nuts in the place of white rice, white pasta, potatoes (baked or fried), macaroni and cheese, cakes, cookies, and donuts.  If youre thirsty, drink water. Coffee and tea are good in moderation, but skip sugary drinks and limit milk and dairy products to one or two daily servings. Keep sugar intake at 6 teaspoons or 24 grams or LESS       Exercise: Aim for 150 min of moderate intensity exercise weekly for heart health, and  weights twice weekly for bone health Stay active - any steps are better than no steps! Aim for 7-9 hours of sleep daily      Mediterranean Diet A Mediterranean diet refers to food and lifestyle choices that are based on the traditions of countries located on the [employer]. It focuses on eating more fruits, vegetables, whole grains, beans, nuts, seeds, and heart-healthy fats, and eating less dairy, meat, eggs, and processed foods with added sugar, salt, and fat. This way of eating has been shown to help prevent certain conditions and improve outcomes for people who have chronic diseases, like kidney disease and heart disease. What are tips for following this plan? Reading food labels Check the serving size of packaged foods. For foods such as rice and pasta, the serving size refers to the amount of cooked product, not dry. Check the total fat in packaged foods. Avoid foods that have saturated fat or trans fats. Check the ingredient list for added sugars, such as corn syrup. Shopping  Buy a variety of foods that offer a balanced diet, including: Fresh fruits and vegetables (produce). Grains, beans, nuts, and seeds. Some of these may be available in unpackaged forms or large amounts (in bulk). Fresh seafood. Poultry and eggs. Low-fat dairy products. Buy whole ingredients instead of prepackaged foods. Buy fresh fruits and vegetables in-season from local farmers markets. Buy plain frozen fruits and vegetables. If you do not have access to quality fresh seafood, buy precooked frozen shrimp or canned fish, such as tuna, salmon, or sardines. Stock your pantry so you always have certain foods on hand, such as olive oil, canned tuna, canned tomatoes, rice, pasta, and beans. Cooking Cook foods with extra-virgin olive oil instead of using butter or other vegetable oils. Have  meat as a side dish, and have vegetables or grains as your main dish. This means having meat in small portions or adding  small amounts of meat to foods like pasta or stew. Use beans or vegetables instead of meat in common dishes like chili or lasagna. Experiment with different cooking methods. Try roasting, broiling, steaming, and sauting vegetables. Add frozen vegetables to soups, stews, pasta, or rice. Add nuts or seeds for added healthy fats and plant protein at each meal. You can add these to yogurt, salads, or vegetable dishes. Marinate fish or vegetables using olive oil, lemon juice, garlic, and fresh herbs. Meal planning Plan to eat one vegetarian meal one day each week. Try to work up to two vegetarian meals, if possible. Eat seafood two or more times a week. Have healthy snacks readily available, such as: Vegetable sticks with hummus. Greek yogurt. Fruit and nut trail mix. Eat balanced meals throughout the week. This includes: Fruit: 2-3 servings a day. Vegetables: 4-5 servings a day. Low-fat dairy: 2 servings a day. Fish, poultry, or lean meat: 1 serving a day. Beans and legumes: 2 or more servings a week. Nuts and seeds: 1-2 servings a day. Whole grains: 6-8 servings a day. Extra-virgin olive oil: 3-4 servings a day. Limit red meat and sweets to only a few servings a month. Lifestyle  Cook and eat meals together with your family, when possible. Drink enough fluid to keep your urine pale yellow. Be physically active every day. This includes: Aerobic exercise like running or swimming. Leisure activities like gardening, walking, or housework. Get 7-8 hours of sleep each night. If recommended by your health care provider, drink red wine in moderation. This means 1 glass a day for nonpregnant women and 2 glasses a day for men. A glass of wine equals 5 oz (150 mL). What foods should I eat? Fruits Apples. Apricots. Avocado. Berries. Bananas. Cherries. Dates. Figs. Grapes. Lemons. Melon. Oranges. Peaches. Plums. Pomegranate. Vegetables Artichokes. Beets. Broccoli. Cabbage. Carrots. Eggplant.  Green beans. Chard. Kale. Spinach. Onions. Leeks. Peas. Squash. Tomatoes. Peppers. Radishes. Grains Whole-grain pasta. Brown rice. Bulgur wheat. Polenta. Couscous. Whole-wheat bread. Orpah Cobbatmeal. Quinoa. Meats and other proteins Beans. Almonds. Sunflower seeds. Pine nuts. Peanuts. Cod. Salmon. Scallops. Shrimp. Tuna. Tilapia. Clams. Oysters. Eggs. Poultry without skin. Dairy Low-fat milk. Cheese. Greek yogurt. Fats and oils Extra-virgin olive oil. Avocado oil. Grapeseed oil. Beverages Water. Red wine. Herbal tea. Sweets and desserts Greek yogurt with honey. Baked apples. Poached pears. Trail mix. Seasonings and condiments Basil. Cilantro. Coriander. Cumin. Mint. Parsley. Sage. Rosemary. Tarragon. Garlic. Oregano. Thyme. Pepper. Balsamic vinegar. Tahini. Hummus. Tomato sauce. Olives. Mushrooms. The items listed above may not be a complete list of foods and beverages you can eat. Contact a dietitian for more information. What foods should I limit? This is a list of foods that should be eaten rarely or only on special occasions. Fruits Fruit canned in syrup. Vegetables Deep-fried potatoes (french fries). Grains Prepackaged pasta or rice dishes. Prepackaged cereal with added sugar. Prepackaged snacks with added sugar. Meats and other proteins Beef. Pork. Lamb. Poultry with skin. Hot dogs. Tomasa BlaseBacon. Dairy Ice cream. Sour cream. Whole milk. Fats and oils Butter. Canola oil. Vegetable oil. Beef fat (tallow). Lard. Beverages Juice. Sugar-sweetened soft drinks. Beer. Liquor and spirits. Sweets and desserts Cookies. Cakes. Pies. Candy. Seasonings and condiments Mayonnaise. Pre-made sauces and marinades. The items listed above may not be a complete list of foods and beverages you should limit. Contact a dietitian for more information. Summary The  Mediterranean diet includes both food and lifestyle choices. Eat a variety of fresh fruits and vegetables, beans, nuts, seeds, and whole grains. Limit  the amount of red meat and sweets that you eat. If recommended by your health care provider, drink red wine in moderation. This means 1 glass a day for nonpregnant women and 2 glasses a day for men. A glass of wine equals 5 oz (150 mL). This information is not intended to replace advice given to you by your health care provider. Make sure you discuss any questions you have with your health care provider. Document Revised: 09/18/2019 Document Reviewed: 07/16/2019 Elsevier Patient Education  2023 Elsevier Inc.   Important Information About Sugar         Signed, Levi Aland, NP  07/18/2022 12:41 PM    [facility]

## 2022-07-18 ENCOUNTER — Ambulatory Visit: Payer: BC Managed Care – PPO | Attending: Nurse Practitioner | Admitting: Nurse Practitioner

## 2022-07-18 ENCOUNTER — Encounter: Payer: Self-pay | Admitting: Nurse Practitioner

## 2022-07-18 VITALS — BP 130/78 | HR 73 | Ht 65.5 in | Wt 190.2 lb

## 2022-07-18 DIAGNOSIS — I370 Nonrheumatic pulmonary valve stenosis: Secondary | ICD-10-CM

## 2022-07-18 DIAGNOSIS — R030 Elevated blood-pressure reading, without diagnosis of hypertension: Secondary | ICD-10-CM | POA: Diagnosis not present

## 2022-07-18 DIAGNOSIS — E785 Hyperlipidemia, unspecified: Secondary | ICD-10-CM | POA: Diagnosis not present

## 2022-07-18 NOTE — Patient Instructions (Signed)
Medication Instructions:   Your physician recommends that you continue on your current medications as directed. Please refer to the Current Medication list given to you today.   *If you need a refill on your cardiac medications before your next appointment, please call your pharmacy*   Lab Work:  None ordered.  If you have labs (blood work) drawn today and your tests are completely normal, you will receive your results only by: MyChart Message (if you have MyChart) OR A paper copy in the mail If you have any lab test that is abnormal or we need to change your treatment, we will call you to review the results.   Testing/Procedures:  Your physician has requested that you have an echocardiogram. Echocardiography is a painless test that uses sound waves to create images of your heart. It provides your doctor with information about the size and shape of your heart and how well your heart's chambers and valves are working. This procedure takes approximately one hour. There are no restrictions for this procedure. Please do NOT wear perfume, or lotions (deodorant is allowed). Please arrive 15 minutes prior to your appointment time.    Follow-Up: At [facility], you and your health needs are our priority.  As part of our continuing mission to provide you with exceptional heart care, we have created designated Provider Care Teams.  These Care Teams include your primary Cardiologist (physician) and Advanced Practice Providers (APPs -  Physician Assistants and Nurse Practitioners) who all work together to provide you with the care you need, when you need it.  We recommend signing up for the patient portal called "MyChart".  Sign up information is provided on this After Visit Summary.  MyChart is used to connect with patients for Virtual Visits (Telemedicine).  Patients are able to view lab/test results, encounter notes, upcoming appointments, etc.  Non-urgent messages can be sent to your  provider as well.   To learn more about what you can do with MyChart, go to [email or web address].    Your next appointment:   1 year(s)  The format for your next appointment:   In Person  Provider:   Carolanne Grumbling, MD   Other Instructions  Your physician wants you to follow-up in: 1 year with Dr. Mayford Knife. You will receive a reminder letter in the mail two months in advance. If you don't receive a letter, please call our office to schedule the follow-up appointment.  Adopting a Healthy Lifestyle.   Weight: Know what a healthy weight is for you (roughly BMI <25) and aim to maintain this. You can calculate your body mass index on your smart phone  Diet: Aim for 7+ servings of fruits and vegetables daily Limit animal fats in diet for cholesterol and heart health - choose grass fed whenever available Avoid highly processed foods (fast food burgers, tacos, fried chicken, pizza, hot dogs, french fries)  Saturated fat comes in the form of butter, lard, coconut oil, margarine, partially hydrogenated oils, and fat in meat. These increase your risk of cardiovascular disease.  Use healthy plant oils, such as olive, canola, soy, corn, sunflower and peanut.  Whole foods such as fruits, vegetables and whole grains have fiber  Men need > 38 grams of fiber per day Women need > 25 grams of fiber per day  Load up on vegetables and fruits - one-half of your plate: Aim for color and variety, and remember that potatoes dont count. Go for whole grains - one-quarter of your plate: Whole wheat,  barley, wheat berries, quinoa, oats, brown rice, and foods made with them. If you want pasta, go with whole wheat pasta. Protein power - one-quarter of your plate: Fish, chicken, beans, and nuts are all healthy, versatile protein sources. Limit red meat. You need carbohydrates for energy! The type of carbohydrate is more important than the amount. Choose carbohydrates such as vegetables, fruits, whole grains, beans,  and nuts in the place of white rice, white pasta, potatoes (baked or fried), macaroni and cheese, cakes, cookies, and donuts.  If youre thirsty, drink water. Coffee and tea are good in moderation, but skip sugary drinks and limit milk and dairy products to one or two daily servings. Keep sugar intake at 6 teaspoons or 24 grams or LESS       Exercise: Aim for 150 min of moderate intensity exercise weekly for heart health, and weights twice weekly for bone health Stay active - any steps are better than no steps! Aim for 7-9 hours of sleep daily      Mediterranean Diet A Mediterranean diet refers to food and lifestyle choices that are based on the traditions of countries located on the [employer]. It focuses on eating more fruits, vegetables, whole grains, beans, nuts, seeds, and heart-healthy fats, and eating less dairy, meat, eggs, and processed foods with added sugar, salt, and fat. This way of eating has been shown to help prevent certain conditions and improve outcomes for people who have chronic diseases, like kidney disease and heart disease. What are tips for following this plan? Reading food labels Check the serving size of packaged foods. For foods such as rice and pasta, the serving size refers to the amount of cooked product, not dry. Check the total fat in packaged foods. Avoid foods that have saturated fat or trans fats. Check the ingredient list for added sugars, such as corn syrup. Shopping  Buy a variety of foods that offer a balanced diet, including: Fresh fruits and vegetables (produce). Grains, beans, nuts, and seeds. Some of these may be available in unpackaged forms or large amounts (in bulk). Fresh seafood. Poultry and eggs. Low-fat dairy products. Buy whole ingredients instead of prepackaged foods. Buy fresh fruits and vegetables in-season from local farmers markets. Buy plain frozen fruits and vegetables. If you do not have access to quality fresh seafood,  buy precooked frozen shrimp or canned fish, such as tuna, salmon, or sardines. Stock your pantry so you always have certain foods on hand, such as olive oil, canned tuna, canned tomatoes, rice, pasta, and beans. Cooking Cook foods with extra-virgin olive oil instead of using butter or other vegetable oils. Have meat as a side dish, and have vegetables or grains as your main dish. This means having meat in small portions or adding small amounts of meat to foods like pasta or stew. Use beans or vegetables instead of meat in common dishes like chili or lasagna. Experiment with different cooking methods. Try roasting, broiling, steaming, and sauting vegetables. Add frozen vegetables to soups, stews, pasta, or rice. Add nuts or seeds for added healthy fats and plant protein at each meal. You can add these to yogurt, salads, or vegetable dishes. Marinate fish or vegetables using olive oil, lemon juice, garlic, and fresh herbs. Meal planning Plan to eat one vegetarian meal one day each week. Try to work up to two vegetarian meals, if possible. Eat seafood two or more times a week. Have healthy snacks readily available, such as: Vegetable sticks with hummus. Greek yogurt. Fruit  and nut trail mix. Eat balanced meals throughout the week. This includes: Fruit: 2-3 servings a day. Vegetables: 4-5 servings a day. Low-fat dairy: 2 servings a day. Fish, poultry, or lean meat: 1 serving a day. Beans and legumes: 2 or more servings a week. Nuts and seeds: 1-2 servings a day. Whole grains: 6-8 servings a day. Extra-virgin olive oil: 3-4 servings a day. Limit red meat and sweets to only a few servings a month. Lifestyle  Cook and eat meals together with your family, when possible. Drink enough fluid to keep your urine pale yellow. Be physically active every day. This includes: Aerobic exercise like running or swimming. Leisure activities like gardening, walking, or housework. Get 7-8 hours of sleep  each night. If recommended by your health care provider, drink red wine in moderation. This means 1 glass a day for nonpregnant women and 2 glasses a day for men. A glass of wine equals 5 oz (150 mL). What foods should I eat? Fruits Apples. Apricots. Avocado. Berries. Bananas. Cherries. Dates. Figs. Grapes. Lemons. Melon. Oranges. Peaches. Plums. Pomegranate. Vegetables Artichokes. Beets. Broccoli. Cabbage. Carrots. Eggplant. Green beans. Chard. Kale. Spinach. Onions. Leeks. Peas. Squash. Tomatoes. Peppers. Radishes. Grains Whole-grain pasta. Brown rice. Bulgur wheat. Polenta. Couscous. Whole-wheat bread. Orpah Cobb. Meats and other proteins Beans. Almonds. Sunflower seeds. Pine nuts. Peanuts. Cod. Salmon. Scallops. Shrimp. Tuna. Tilapia. Clams. Oysters. Eggs. Poultry without skin. Dairy Low-fat milk. Cheese. Greek yogurt. Fats and oils Extra-virgin olive oil. Avocado oil. Grapeseed oil. Beverages Water. Red wine. Herbal tea. Sweets and desserts Greek yogurt with honey. Baked apples. Poached pears. Trail mix. Seasonings and condiments Basil. Cilantro. Coriander. Cumin. Mint. Parsley. Sage. Rosemary. Tarragon. Garlic. Oregano. Thyme. Pepper. Balsamic vinegar. Tahini. Hummus. Tomato sauce. Olives. Mushrooms. The items listed above may not be a complete list of foods and beverages you can eat. Contact a dietitian for more information. What foods should I limit? This is a list of foods that should be eaten rarely or only on special occasions. Fruits Fruit canned in syrup. Vegetables Deep-fried potatoes (french fries). Grains Prepackaged pasta or rice dishes. Prepackaged cereal with added sugar. Prepackaged snacks with added sugar. Meats and other proteins Beef. Pork. Lamb. Poultry with skin. Hot dogs. Tomasa Blase. Dairy Ice cream. Sour cream. Whole milk. Fats and oils Butter. Canola oil. Vegetable oil. Beef fat (tallow). Lard. Beverages Juice. Sugar-sweetened soft drinks. Beer. Liquor  and spirits. Sweets and desserts Cookies. Cakes. Pies. Candy. Seasonings and condiments Mayonnaise. Pre-made sauces and marinades. The items listed above may not be a complete list of foods and beverages you should limit. Contact a dietitian for more information. Summary The Mediterranean diet includes both food and lifestyle choices. Eat a variety of fresh fruits and vegetables, beans, nuts, seeds, and whole grains. Limit the amount of red meat and sweets that you eat. If recommended by your health care provider, drink red wine in moderation. This means 1 glass a day for nonpregnant women and 2 glasses a day for men. A glass of wine equals 5 oz (150 mL). This information is not intended to replace advice given to you by your health care provider. Make sure you discuss any questions you have with your health care provider. Document Revised: 09/18/2019 Document Reviewed: 07/16/2019 Elsevier Patient Education  2023 Elsevier Inc.   Important Information About Sugar

## 2022-08-23 ENCOUNTER — Ambulatory Visit (HOSPITAL_COMMUNITY): Payer: BC Managed Care – PPO | Attending: Cardiology

## 2022-08-23 DIAGNOSIS — I370 Nonrheumatic pulmonary valve stenosis: Secondary | ICD-10-CM | POA: Insufficient documentation

## 2022-08-23 LAB — ECHOCARDIOGRAM COMPLETE
Area-P 1/2: 3.63 cm2
S' Lateral: 3.25 cm

## 2023-06-14 LAB — LAB REPORT - SCANNED: EGFR: 113

## 2023-06-19 ENCOUNTER — Other Ambulatory Visit: Payer: Self-pay | Admitting: Family Medicine

## 2023-06-19 DIAGNOSIS — R748 Abnormal levels of other serum enzymes: Secondary | ICD-10-CM

## 2023-07-08 ENCOUNTER — Ambulatory Visit
Admission: RE | Admit: 2023-07-08 | Discharge: 2023-07-08 | Disposition: A | Discharge status: Home / Self Care | Payer: BC Managed Care – PPO | Source: Ambulatory Visit | Attending: Family Medicine | Admitting: Family Medicine

## 2023-07-08 DIAGNOSIS — R748 Abnormal levels of other serum enzymes: Secondary | ICD-10-CM

## 2023-07-17 ENCOUNTER — Other Ambulatory Visit: Payer: Self-pay | Admitting: Family Medicine

## 2023-07-17 DIAGNOSIS — K769 Liver disease, unspecified: Secondary | ICD-10-CM

## 2023-08-09 ENCOUNTER — Ambulatory Visit
Admission: RE | Admit: 2023-08-09 | Discharge: 2023-08-09 | Disposition: A | Discharge status: Home / Self Care | Payer: BC Managed Care – PPO | Source: Ambulatory Visit | Attending: Family Medicine | Admitting: Family Medicine

## 2023-08-09 DIAGNOSIS — K769 Liver disease, unspecified: Secondary | ICD-10-CM

## 2023-08-09 MED ORDER — GADOPICLENOL 0.5 MMOL/ML IV SOLN
8.0000 mL | Freq: Once | INTRAVENOUS | Status: AC | PRN
  Administered 2023-08-09: 8 mL via INTRAVENOUS

## 2023-09-04 ENCOUNTER — Other Ambulatory Visit: Payer: Self-pay

## 2023-09-05 ENCOUNTER — Encounter: Payer: Self-pay | Admitting: Physician Assistant

## 2023-09-05 ENCOUNTER — Other Ambulatory Visit (INDEPENDENT_AMBULATORY_CARE_PROVIDER_SITE_OTHER): Payer: 59

## 2023-09-05 ENCOUNTER — Ambulatory Visit: Payer: 59 | Admitting: Physician Assistant

## 2023-09-05 VITALS — BP 122/80 | HR 90 | Ht 65.0 in | Wt 181.0 lb

## 2023-09-05 DIAGNOSIS — Z800 Family history of malignant neoplasm of digestive organs: Secondary | ICD-10-CM

## 2023-09-05 DIAGNOSIS — R748 Abnormal levels of other serum enzymes: Secondary | ICD-10-CM

## 2023-09-05 DIAGNOSIS — R932 Abnormal findings on diagnostic imaging of liver and biliary tract: Secondary | ICD-10-CM

## 2023-09-05 LAB — HEPATIC FUNCTION PANEL
ALT: 21 U/L (ref 0–35)
AST: 22 U/L (ref 0–37)
Albumin: 4.4 g/dL (ref 3.5–5.2)
Alkaline Phosphatase: 196 U/L — ABNORMAL HIGH (ref 39–117)
Bilirubin, Direct: 0.1 mg/dL (ref 0.0–0.3)
Total Bilirubin: 0.4 mg/dL (ref 0.2–1.2)
Total Protein: 7.4 g/dL (ref 6.0–8.3)

## 2023-09-05 NOTE — Patient Instructions (Addendum)
 _______________________________________________________  If your blood pressure at your visit was 140/90 or greater, please contact your primary care physician to follow up on this.  _______________________________________________________  If you are age 43 or older, your body mass index should be between 23-30. Your Body mass index is 30.12 kg/m. If this is out of the aforementioned range listed, please consider follow up with your Primary Care Provider.  If you are age 52 or younger, your body mass index should be between 19-25. Your Body mass index is 30.12 kg/m. If this is out of the aformentioned range listed, please consider follow up with your Primary Care Provider.   ________________________________________________________  The [facility] providers would like to encourage you to use MYCHART to communicate with providers for non-urgent requests or questions.  Due to long hold times on the telephone, sending your provider a message by [facility] may be a faster and more efficient way to get a response.  Please allow 48 business hours for a response.  Please remember that this is for non-urgent requests.  _______________________________________________________  Your provider has requested that you go to the basement level for lab work before leaving today. Press B on the elevator. The lab is located at the first door on the left as you exit the elevator.  You have been scheduled for an appointment with Delon Failing on 10-29-23 at 9am . Please arrive 10 minutes early for your appointment. We will move your appointment sooner based on MRI results   You have been scheduled for an MRI at [facility] on 09-16-23. Your appointment time is 10:30am. Please arrive to admitting (at main entrance of the hospital) 30 minutes prior to your appointment time for registration purposes. Please make certain not to have anything to eat or drink 6 hours prior to your test. In addition, if you have  any metal in your body, have a pacemaker or defibrillator, please be sure to let your ordering physician know. This test typically takes 45 minutes to 1 hour to complete. Should you need to reschedule, please call [phone] to do so.  It was a pleasure to see you today!  Thank you for trusting me with your gastrointestinal care!

## 2023-09-05 NOTE — Progress Notes (Addendum)
 Chief Complaint: Liver lesions  HPI:    Kathleen Little is a 43 year old female with a past medical history as listed below including GERD, who was referred to me by Douglass Ivanoff, MD for a complaint of liver lesions.    July 2024 alk phos elevated 203, October 2024 alk phos 197 (reported per patient).    07/08/2023 right upper quadrant ultrasound for abnormal LFTs with potential solid liver lesions identified measuring up to 7.2 cm of uncertain etiology.  Recommended MRI.    08/09/2023 MRI with multiple (at least 8) enhancing lesions of varying sizes throughout the liver, largest of which is exophytic and arises from the inferior margin of hepatic segment IVB, measuring 7.2 x 5.8 cm.  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  At that point recommended hepatobiliary referral and follow-up Eovist  enhanced MRI to more confidently distinguish between the 2.  No evidence of lymphadenopathy or metastatic disease.  No acute findings.    Today, the patient tells me she has always had occasional issues with gas bloating and diarrhea but as she has gotten older these seem to have gotten better.  Also has occasional reflux symptoms based off of what she eats and takes a as needed Pepcid or Tums which works well for her.  None of the symptoms have increased recently, though she has been more observant of a right upper quadrant discomfort here and there ever since being told she has lesions in her liver.  She did discuss this with her gynecologist who thought it could possibly be related to her birth control which was changed from [employer] to [employer].    Also discusses a diagnosis of colon cancer in her father diagnosed at the age of 31.    Denies fever, chills, unintentional weight loss, nausea, vomiting or abdominal pain.     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 No pertinent surgical history.  Current Outpatient Medications  Medication Sig Dispense Refill   azelastine (ASTELIN) 0.1 % nasal spray Place 1 spray into both nostrils 2 (two) times daily.     cetirizine (ZYRTEC) 10 MG tablet Take 10 mg by mouth daily.     fluticasone (FLONASE ALLERGY RELIEF) 50 MCG/ACT nasal spray Place 1 spray into both nostrils daily.     ibuprofen (ADVIL) 200 MG tablet Take 200 mg by mouth every 6 (six) hours as needed.     Multiple Vitamin (MULTIVITAMIN ADULT) TABS Take 1 tablet by mouth daily.     SUMAtriptan (IMITREX) 25 MG tablet Take 25 mg by mouth every 2 (two) hours as needed for migraine. May repeat in 2 hours if headache persists or recurs.     No current facility-administered medications for this visit.    Allergies as of 09/05/2023   (No Known Allergies)    Family History  Problem Relation Age of Onset   Cancer - Colon Father    Liver disease Neg Hx    Esophageal cancer Neg Hx     Social History   Socioeconomic History   Marital status: Single    Spouse name: Not on file   Number of children: Not  on file   Years of education: Not on file   Highest education level: Not on file  Occupational History   Not on file  Tobacco Use   Smoking status: Never   Smokeless tobacco: Never  Vaping Use   Vaping status: Never Used  Substance and Sexual Activity   Alcohol use: Yes    Comment: rare glass of wine   Drug use: No   Sexual activity: Not on file  Other Topics Concern   Not on file  Social History Narrative   Not on file   Social Drivers of Health   Financial Resource Strain: Not on file  Food Insecurity: Not on file  Transportation Needs: Not on file  Physical Activity: Not on file  Stress: Not on file  Social Connections: Not on file  Intimate Partner  Violence: Not on file    Review of Systems:    Constitutional: No weight loss, fever or chills Skin: No rash  Cardiovascular: No chest pain Respiratory: No SOB  Gastrointestinal: See HPI and otherwise negative Genitourinary: No dysuria  Neurological: No headache, dizziness or syncope Musculoskeletal: No new muscle or joint pain Hematologic: No bleeding  Psychiatric: No history of depression or anxiety   Physical Exam:  Vital signs: BP 122/80   Pulse 90   Ht 5' 5 (1.651 m)   Wt 181 lb (82.1 kg)   BMI 30.12 kg/m    Constitutional:   Pleasant  overweight Caucasian female appears to be in NAD, Well developed, Well nourished, alert and cooperative Head:  Normocephalic and atraumatic. Eyes:   PEERL, EOMI. No icterus. Conjunctiva pink. Ears:  Normal auditory acuity. Neck:  Supple Throat: Oral cavity and pharynx without inflammation, swelling or lesion.  Respiratory: Respirations even and unlabored. Lungs clear to auscultation bilaterally.   No wheezes, crackles, or rhonchi.  Cardiovascular: Normal S1, S2. No MRG. Regular rate and rhythm. No peripheral edema, cyanosis or pallor.  Gastrointestinal:  some hardness across RUQ and into epigastrum, nondistended, nontender. No rebound or guarding. Normal bowel sounds. No appreciable masses or hepatomegaly. Rectal:  Not performed.  Msk:  Symmetrical without gross deformities. Without edema, no deformity or joint abnormality.  Neurologic:  Alert and  oriented x4;  grossly normal neurologically.  Skin:   Dry and intact without significant lesions or rashes. Psychiatric: Demonstrates good judgement and reason without abnormal affect or behaviors.  Requesting recent labs see HPI for imaging.  Assessment: 1.  Abnormal imaging of the liver: Recent right upper quadrant ultrasound and MRI with multiple solid liver lesions, thought benign but requiring further imaging 2.  Elevated alk phos: Per patient 203 in July down to 197 in October; consider  relation to above 3.  Family history of colon cancer: In her father diagnosed at the age of 45  Plan: 1.  Discussed case with Dr. Stacia who will be the patient's primary GI physician here.  Recommended a repeat MRI with Eovist  contrast as per their recommendations. 2.  Will repeat LFTs and add CA 19-9 and AFP today.  Also requesting previous labs. 3.  Patient to continue as needed Pepcid/Tums 4.  Patient be due for a colonoscopy at the age of 20 5.  Patient scheduled a follow-up with me in 6 weeks as this is when I had availability, can move this appointment forward pending imaging.  Kathleen Failing, PA-C [facility] 09/05/2023, 8:39 AM  Cc: Douglass Ivanoff, MD   Addendum: 09/05/2023 11:51 AM  Received patient's recent labs CMP 02/22/2023 with  elevated alk phos at 203 and otherwise normal.  Vitamin D low at 24.7.  CBC normal.  03/08/2023 FOBT testing negative.  No change in the immediate plans.  Kathleen Failing, PA-C  CMP 06/14/2023 with alk phos elevated at 197 and otherwise normal.  Vitamin D still low at 21.2.

## 2023-09-09 ENCOUNTER — Telehealth: Payer: Self-pay

## 2023-09-09 LAB — CANCER ANTIGEN 19-9: CA 19-9: 5 U/mL (ref <34)

## 2023-09-09 LAB — AFP TUMOR MARKER: AFP-Tumor Marker: 1.4 ng/mL

## 2023-09-09 NOTE — Telephone Encounter (Signed)
-----   Message from Delon Hendricks Failing sent at 09/09/2023  9:05 AM EST ----- Regarding: early colonoscopy Dr. Stacia would like patient to undergo early colonoscopy.  Guidelines have changed given her family history of colon cancer will need to start colon cancer screening at the age of 55.  Can you go ahead and get her set up with Dr. Stacia in the [facility].  We can bring her back into discuss further if she would like otherwise she can be a direct colonoscopy.  Thanks, JL L ----- Message ----- From: Stacia Glendia BRAVO, MD Sent: 09/09/2023   6:27 AM EST To: Delon Hendricks Failing, PA     ----- Message ----- From: Failing Delon Hendricks, [place] Sent: 09/05/2023   9:31 AM EST To: Glendia BRAVO Stacia, MD

## 2023-09-09 NOTE — Progress Notes (Signed)
 Agree with the assessment and plan as outlined by Delon Failing, PA-C, except that patient should undergo early colon cancer screening given her first degree relative with colon cancer before the age of 66.  Guidelines would suggest starting at age 43 with 5 year interval colonoscopies.  Ekansh Sherk E. Stacia, MD

## 2023-09-09 NOTE — Telephone Encounter (Signed)
 Called and spoke with patient regarding recommendations for early colonoscopy. Patient requested to schedule in April due to work. Pt is aware that April schedule is not available at this time, we will give her a call to schedule once available. Pt prefers an appt with week of 12/09/23, any day or time that week. Pt had no concerns at the end of the call.

## 2023-09-16 ENCOUNTER — Ambulatory Visit (HOSPITAL_COMMUNITY): Admission: RE | Admit: 2023-09-16 | Payer: 59 | Source: Ambulatory Visit

## 2023-09-16 ENCOUNTER — Ambulatory Visit (HOSPITAL_COMMUNITY)
Admission: RE | Admit: 2023-09-16 | Discharge: 2023-09-16 | Disposition: A | Discharge status: Home / Self Care | Payer: 59 | Source: Ambulatory Visit | Attending: Physician Assistant | Admitting: Physician Assistant

## 2023-09-16 DIAGNOSIS — R932 Abnormal findings on diagnostic imaging of liver and biliary tract: Secondary | ICD-10-CM

## 2023-09-16 DIAGNOSIS — R748 Abnormal levels of other serum enzymes: Secondary | ICD-10-CM | POA: Diagnosis present

## 2023-09-16 MED ORDER — GADOXETATE DISODIUM 0.25 MMOL/ML IV SOLN
5.0000 mL | Freq: Once | INTRAVENOUS | Status: AC | PRN
  Administered 2023-09-16: 5 mL via INTRAVENOUS

## 2023-09-18 ENCOUNTER — Telehealth: Payer: Self-pay | Admitting: Physician Assistant

## 2023-09-18 NOTE — Telephone Encounter (Signed)
09/18/23 Telephone Call  Called to speak with the patient in regards to results from MRI.  Dr. 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  Patient will need a referral to the surgical team to discuss possible resection.  There is a risk that these lesions can break open and cause bleeding which is the reason they would need to be removed.  She also needs to stop any estrogen containing medication including birth control.  Will also have my nurse put in a 54-month follow-up MRI.  Of course if she has surgery before, then would not need this.  Will try and call the patient back tomorrow as well.  Hyacinth Meeker, PA-C

## 2023-09-18 NOTE — Telephone Encounter (Signed)
20-month MRI reminder in epic. Referral, records, patient's demographic, and insurance information have been faxed to [facility] at [phone].

## 2023-09-18 NOTE — Telephone Encounter (Signed)
-----   Message from Jenel Lucks sent at 09/18/2023  1:12 PM EST ----- As the lesions appear most consistent with hepatic adenomas, I would recommend referral to surgery to discuss possible resection.  There is risk for severe, spontaneous hemorrhage for lesions > 5cm and so surgery should at least be discussed.  Would still plan for repeat MRI in case surgery is not pursued.  Patient should also completely avoid oral contraceptives and any other estrogen-containing medication.  Thanks ----- Message ----- From: Unk Lightning, PA Sent: 09/17/2023   9:39 AM EST To: Jenel Lucks, MD  Proceed with 80-month repeat for follow-up?  Any other recommendations?  Thanks, JL L

## 2023-09-19 ENCOUNTER — Telehealth: Payer: Self-pay | Admitting: Physician Assistant

## 2023-09-19 NOTE — Telephone Encounter (Signed)
Referral was placed yesterday  Referral, records, patient's demographic, and insurance information have been faxed to [facility] at [phone].

## 2023-09-19 NOTE — Telephone Encounter (Signed)
Telephone Call  09/19/2023 9:15 AM  Spoke with the patient in regards to hepatic adenomas on recent MRI.  Answered her questions.  She has stopped her birth control as of last MRI in December and the adenoma has actually shrunk a little bit.  She wonders if she can just observe for another 6 months off of her birth control to see if it shrinks.  Told her to discuss further with the surgical team.  Also wonders if this is related to her alk phos elevation.  I do think it is.  Patient will call back if she has any further questions or concerns.  [place]-please make sure referral sent to surgical team.  Hyacinth Meeker, PA-C

## 2023-09-30 NOTE — Telephone Encounter (Signed)
Spoke with patient, she would not be available for colonoscopy until June. Advised her to call back in April to schedule for June. Cancelled appointment with Victorino Dike.

## 2023-09-30 NOTE — Telephone Encounter (Signed)
Lm on vm for patient to return call.   Pt does not need upcoming OV with Victorino Dike as they have already reviewed results over the phone on 09/19/23 (see telephone encounter). Dr. Tomasa Rand will not be in the [facility] the week patient requested to schedule her colonoscopy. Schedule at her convenience.

## 2023-10-04 ENCOUNTER — Ambulatory Visit: Payer: 59 | Attending: Cardiology | Admitting: Cardiology

## 2023-10-04 ENCOUNTER — Encounter: Payer: Self-pay | Admitting: Cardiology

## 2023-10-04 VITALS — BP 142/84 | HR 84 | Ht 65.5 in | Wt 185.0 lb

## 2023-10-04 DIAGNOSIS — I370 Nonrheumatic pulmonary valve stenosis: Secondary | ICD-10-CM

## 2023-10-04 NOTE — Progress Notes (Signed)
 Cardiology Office Note:    Date:  10/04/2023   ID:  Kathleen Little, DOB [birth year], MRN [id number]  PCP:  Douglass Ivanoff, MD  Cardiologist:  None    Referring MD: Douglass Ivanoff, MD   Chief Complaint  Patient presents with   Follow-up    Pulmonic stenosis    History of Present Illness:    Kathleen Little is a 43 y.o. female with a hx of pulmonic stenosis.  Her last 2D echo 08/15/2022 showed EF 61% with normal RV size and systolic function, no PHTN and mild pulmonic stenosis with peak gradient 17 mmHg and V-max 2.1 m/s.    She is here today for followup and is doing well.  She has been having intermittent feeling like she feels like she cannot get a deep breath.  It is random usually occurs when sitting watching TV or in bed.  She runs and has no problems really with running.  She does have some asthma. She denies any chest pain or pressure, PND, orthopnea, dizziness, palpitations or syncope. She intermittently will have some swelling in her legs but is very random.  She is compliant with her meds and is tolerating meds with no SE.    Past Medical History:  Diagnosis Date   Environmental allergies    GERD (gastroesophageal reflux disease)    Heart murmur    Liver mass    Pulmonic stenosis    2D echo 06/2019 showed mild PS with peak PVG and mean PVG , mild PR    No past surgical history on file.  Current Medications: Current Meds  Medication Sig   azelastine (ASTELIN) 0.1 % nasal spray Place 1 spray into both nostrils 2 (two) times daily.   cetirizine (ZYRTEC) 10 MG tablet Take 10 mg by mouth daily.   Cholecalciferol (VITAMIN D3) 50 MCG (2000 UT) capsule Take 2,000 Units by mouth daily.   fluticasone (FLONASE ALLERGY RELIEF) 50 MCG/ACT nasal spray Place 1 spray into both nostrils daily.   ibuprofen (ADVIL) 200 MG tablet Take 200 mg by mouth every 6 (six) hours as needed.   Multiple Vitamin (MULTIVITAMIN ADULT) TABS Take 1 tablet by mouth daily.   norethindrone (MICRONOR)  0.35 MG tablet Take 1 tablet by mouth daily.   SUMAtriptan (IMITREX) 25 MG tablet Take 25 mg by mouth every 2 (two) hours as needed for migraine. May repeat in 2 hours if headache persists or recurs.     Allergies:   Patient has no known allergies.   Social History   Socioeconomic History   Marital status: Single    Spouse name: Not on file   Number of children: Not on file   Years of education: Not on file   Highest education level: Not on file  Occupational History   Not on file  Tobacco Use   Smoking status: Never   Smokeless tobacco: Never  Vaping Use   Vaping status: Never Used  Substance and Sexual Activity   Alcohol use: Yes    Comment: rare glass of wine   Drug use: No   Sexual activity: Not on file  Other Topics Concern   Not on file  Social History Narrative   Not on file   Social Drivers of Health   Financial Resource Strain: Not on file  Food Insecurity: Not on file  Transportation Needs: Not on file  Physical Activity: Not on file  Stress: Not on file  Social Connections: Not on file     Family History:  The patient's family history includes Cancer - Colon in her father. There is no history of Liver disease or Esophageal cancer.  ROS:   Please see the history of present illness.    ROS  All other systems reviewed and negative.   EKGs/Labs/Other Studies Reviewed:    The following studies were reviewed today:   Recent Labs: 09/05/2023: ALT 21   Recent Lipid Panel No results found for: CHOL, TRIG, HDL, CHOLHDL, VLDL, LDLCALC, LDLDIRECT  Physical Exam:    VS:  BP (!) 142/84   Pulse 84   Ht 5' 5.5 (1.664 m)   Wt 185 lb (83.9 kg)   BMI 30.32 kg/m     Wt Readings from Last 3 Encounters:  10/04/23 185 lb (83.9 kg)  09/05/23 181 lb (82.1 kg)  07/18/22 190 lb 3.2 oz (86.3 kg)    GEN: Well nourished, well developed in no acute distress HEENT: Normal NECK: No JVD; No carotid bruits LYMPHATICS: No lymphadenopathy CARDIAC:RRR,  no murmurs, rubs, gallops RESPIRATORY:  Clear to auscultation without rales, wheezing or rhonchi  ABDOMEN: Soft, non-tender, non-distended MUSCULOSKELETAL:  No edema; No deformity  SKIN: Warm and dry NEUROLOGIC:  Alert and oriented x 3 PSYCHIATRIC:  Normal affect  ASSESSMENT:    1. Nonrheumatic pulmonary valve stenosis     PLAN:    In order of problems listed above:  1.  Pulmonic stenosis -2D echo 06/2019 showed mild PS with mean PVG with mild PR -2D echo 08/15/2022 with mild pulmonic stenosis mean PV gradient 17 mmHg with V-max 2.1 m/s (there has been no change in gradient since 2019) -Repeat echo 07/2024  2.  Elevated BP with no dx of HTN -at home SBP in the 120-130's/80's  3.  ?SOB -this sounds like anxiety with a feeling she cannot get a deep breath -no further workup at this time  Medication Adjustments/Labs and Tests Ordered: Current medicines are reviewed at length with the patient today.  Concerns regarding medicines are outlined above.  Orders Placed This Encounter  Procedures   EKG 12-Lead   No orders of the defined types were placed in this encounter.   Signed, Wilbert Bihari, MD  10/04/2023 3:56 PM    [facility]

## 2023-10-04 NOTE — Patient Instructions (Signed)
 Medication Instructions:  Your physician recommends that you continue on your current medications as directed. Please refer to the Current Medication list given to you today.  *If you need a refill on your cardiac medications before your next appointment, please call your pharmacy*   Lab Work: None.  If you have labs (blood work) drawn today and your tests are completely normal, you will receive your results only by: MyChart Message (if you have MyChart) OR A paper copy in the mail If you have any lab test that is abnormal or we need to change your treatment, we will call you to review the results.   Testing/Procedures: Your physician has requested that you have an echocardiogram in December of 2025. Echocardiography is a painless test that uses sound waves to create images of your heart. It provides your doctor with information about the size and shape of your heart and how well your heart's chambers and valves are working. This procedure takes approximately one hour. There are no restrictions for this procedure. Please do NOT wear cologne, perfume, aftershave, or lotions (deodorant is allowed). Please arrive 15 minutes prior to your appointment time.  Please note: We ask at that you not bring children with you during ultrasound (echo/ vascular) testing. Due to room size and safety concerns, children are not allowed in the ultrasound rooms during exams. Our front office staff cannot provide observation of children in our lobby area while testing is being conducted. An adult accompanying a patient to their appointment will only be allowed in the ultrasound room at the discretion of the ultrasound technician under special circumstances. We apologize for any inconvenience.    Follow-Up:  Your next appointment:   1 year(s)  Provider:   Dr. Wilbert Bihari, MD

## 2023-10-04 NOTE — Addendum Note (Signed)
 Addended by: Cherylyn Cos on: 10/04/2023 04:10 PM   Modules accepted: Orders

## 2023-10-29 ENCOUNTER — Ambulatory Visit: Payer: 59 | Admitting: Physician Assistant

## 2023-11-01 ENCOUNTER — Ambulatory Visit: Payer: 59 | Admitting: Physician Assistant

## 2024-01-14 ENCOUNTER — Other Ambulatory Visit: Payer: Self-pay | Admitting: Surgery

## 2024-01-14 DIAGNOSIS — D134 Benign neoplasm of liver: Secondary | ICD-10-CM

## 2024-01-14 DIAGNOSIS — K7689 Other specified diseases of liver: Secondary | ICD-10-CM

## 2024-02-04 ENCOUNTER — Other Ambulatory Visit

## 2024-02-06 ENCOUNTER — Ambulatory Visit
Admission: RE | Admit: 2024-02-06 | Discharge: 2024-02-06 | Disposition: A | Discharge status: Home / Self Care | Source: Ambulatory Visit | Attending: Surgery | Admitting: Surgery

## 2024-02-06 DIAGNOSIS — K7689 Other specified diseases of liver: Secondary | ICD-10-CM

## 2024-02-06 DIAGNOSIS — D134 Benign neoplasm of liver: Secondary | ICD-10-CM

## 2024-02-06 MED ORDER — GADOXETATE DISODIUM 0.25 MMOL/ML IV SOLN
8.0000 mL | Freq: Once | INTRAVENOUS | Status: AC | PRN
  Administered 2024-02-06: 8 mL via INTRAVENOUS

## 2024-07-10 ENCOUNTER — Other Ambulatory Visit: Payer: Self-pay | Admitting: Surgery

## 2024-07-10 DIAGNOSIS — K7689 Other specified diseases of liver: Secondary | ICD-10-CM

## 2024-07-10 DIAGNOSIS — D134 Benign neoplasm of liver: Secondary | ICD-10-CM

## 2024-07-14 ENCOUNTER — Encounter: Payer: Self-pay | Admitting: Surgery

## 2024-07-30 ENCOUNTER — Ambulatory Visit
Admission: RE | Admit: 2024-07-30 | Discharge: 2024-07-30 | Disposition: A | Source: Ambulatory Visit | Attending: Surgery

## 2024-07-30 DIAGNOSIS — D134 Benign neoplasm of liver: Secondary | ICD-10-CM

## 2024-07-30 DIAGNOSIS — K7689 Other specified diseases of liver: Secondary | ICD-10-CM

## 2024-07-30 MED ORDER — GADOXETATE DISODIUM 0.25 MMOL/ML IV SOLN: 8.0000 mL | Freq: Once | INTRAVENOUS | Status: DC | PRN

## 2024-08-17 ENCOUNTER — Ambulatory Visit: Payer: Self-pay | Admitting: Cardiology

## 2024-08-17 ENCOUNTER — Ambulatory Visit (HOSPITAL_COMMUNITY)
Admission: RE | Admit: 2024-08-17 | Discharge: 2024-08-17 | Disposition: A | Discharge status: Home / Self Care | Payer: 59 | Source: Ambulatory Visit | Attending: Cardiology | Admitting: Cardiology

## 2024-08-17 DIAGNOSIS — I370 Nonrheumatic pulmonary valve stenosis: Secondary | ICD-10-CM | POA: Diagnosis present

## 2024-08-17 LAB — ECHOCARDIOGRAM COMPLETE
Area-P 1/2: 4.04 cm2
S' Lateral: 3.1 cm

## 2024-08-21 ENCOUNTER — Other Ambulatory Visit: Payer: Self-pay

## 2024-08-21 DIAGNOSIS — I370 Nonrheumatic pulmonary valve stenosis: Secondary | ICD-10-CM

## 2024-08-25 ENCOUNTER — Ambulatory Visit
Admission: RE | Admit: 2024-08-25 | Discharge: 2024-08-25 | Disposition: A | Discharge status: Home / Self Care | Source: Ambulatory Visit | Attending: Surgery | Admitting: Surgery

## 2024-08-25 MED ORDER — GADOPICLENOL 0.5 MMOL/ML IV SOLN
8.5000 mL | Freq: Once | INTRAVENOUS | Status: AC | PRN
  Administered 2024-08-25: 8.5 mL via INTRAVENOUS
